# Patient Record
Sex: Female | Born: 1965 | Race: White | Hispanic: No | Marital: Married | Smoking: Never smoker
Health system: Southern US, Community
[De-identification: ages and names within clinical notes are randomized; demographics above are authoritative.]

## PROBLEM LIST (undated history)

## (undated) DIAGNOSIS — E7800 Pure hypercholesterolemia, unspecified: Secondary | ICD-10-CM

## (undated) DIAGNOSIS — I10 Essential (primary) hypertension: Secondary | ICD-10-CM

## (undated) DIAGNOSIS — D6859 Other primary thrombophilia: Secondary | ICD-10-CM

## (undated) DIAGNOSIS — J310 Chronic rhinitis: Secondary | ICD-10-CM

## (undated) DIAGNOSIS — K219 Gastro-esophageal reflux disease without esophagitis: Secondary | ICD-10-CM

## (undated) DIAGNOSIS — F419 Anxiety disorder, unspecified: Secondary | ICD-10-CM

## (undated) HISTORY — PX: BREAST SURGERY: SHX581

## (undated) HISTORY — PX: TUBAL LIGATION: SHX77

---

## 2005-07-06 ENCOUNTER — Ambulatory Visit: Payer: Self-pay | Admitting: Family Medicine

## 2005-07-28 ENCOUNTER — Ambulatory Visit: Payer: Self-pay | Admitting: Family Medicine

## 2007-01-30 ENCOUNTER — Ambulatory Visit: Payer: Self-pay | Admitting: Obstetrics and Gynecology

## 2008-02-05 ENCOUNTER — Ambulatory Visit: Payer: Self-pay | Admitting: Obstetrics and Gynecology

## 2009-02-10 ENCOUNTER — Ambulatory Visit: Payer: Self-pay | Admitting: Obstetrics and Gynecology

## 2009-12-08 ENCOUNTER — Ambulatory Visit: Payer: Self-pay | Admitting: Obstetrics and Gynecology

## 2009-12-09 ENCOUNTER — Ambulatory Visit: Payer: Self-pay | Admitting: Internal Medicine

## 2009-12-11 ENCOUNTER — Ambulatory Visit: Payer: Self-pay | Admitting: Obstetrics and Gynecology

## 2010-03-31 ENCOUNTER — Ambulatory Visit: Payer: Self-pay | Admitting: Obstetrics and Gynecology

## 2011-08-15 ENCOUNTER — Ambulatory Visit: Payer: Self-pay | Admitting: Obstetrics and Gynecology

## 2012-08-15 ENCOUNTER — Ambulatory Visit: Payer: Self-pay | Admitting: Obstetrics and Gynecology

## 2013-08-19 ENCOUNTER — Ambulatory Visit: Payer: Self-pay | Admitting: Obstetrics and Gynecology

## 2014-09-08 ENCOUNTER — Ambulatory Visit: Payer: Self-pay | Admitting: Obstetrics and Gynecology

## 2014-09-16 ENCOUNTER — Ambulatory Visit: Payer: Self-pay | Admitting: Obstetrics and Gynecology

## 2015-01-16 LAB — BASIC METABOLIC PANEL
BUN: 11 mg/dL (ref 4–21)
CREATININE: 0.6 mg/dL (ref 0.5–1.1)
GLUCOSE: 101 mg/dL
Potassium: 4.3 mmol/L (ref 3.4–5.3)
Sodium: 138 mmol/L (ref 137–147)

## 2015-01-16 LAB — LIPID PANEL
Cholesterol: 192 mg/dL (ref 0–200)
HDL: 43 mg/dL (ref 35–70)
LDL CALC: 117 mg/dL
LDL/HDL RATIO: 2.7
TRIGLYCERIDES: 160 mg/dL (ref 40–160)

## 2015-01-16 LAB — TSH: TSH: 2.06 u[IU]/mL (ref 0.41–5.90)

## 2015-01-16 LAB — HEPATIC FUNCTION PANEL
ALT: 12 U/L (ref 7–35)
AST: 14 U/L (ref 13–35)
Alkaline Phosphatase: 87 U/L (ref 25–125)
Bilirubin, Total: 0.3 mg/dL

## 2015-01-16 LAB — CBC AND DIFFERENTIAL
HCT: 35 % — AB (ref 36–46)
Hemoglobin: 11.4 g/dL — AB (ref 12.0–16.0)
NEUTROS ABS: 6 /uL
PLATELETS: 376 10*3/uL (ref 150–399)
WBC: 9.1 10^3/mL

## 2015-06-19 DIAGNOSIS — E1169 Type 2 diabetes mellitus with other specified complication: Secondary | ICD-10-CM | POA: Insufficient documentation

## 2015-06-19 DIAGNOSIS — D6859 Other primary thrombophilia: Secondary | ICD-10-CM | POA: Insufficient documentation

## 2015-06-19 DIAGNOSIS — I152 Hypertension secondary to endocrine disorders: Secondary | ICD-10-CM | POA: Insufficient documentation

## 2015-06-19 DIAGNOSIS — I10 Essential (primary) hypertension: Secondary | ICD-10-CM | POA: Insufficient documentation

## 2015-06-19 DIAGNOSIS — J309 Allergic rhinitis, unspecified: Secondary | ICD-10-CM | POA: Insufficient documentation

## 2015-06-19 DIAGNOSIS — F419 Anxiety disorder, unspecified: Secondary | ICD-10-CM | POA: Insufficient documentation

## 2015-06-19 DIAGNOSIS — E7800 Pure hypercholesterolemia, unspecified: Secondary | ICD-10-CM | POA: Insufficient documentation

## 2015-06-19 DIAGNOSIS — E785 Hyperlipidemia, unspecified: Secondary | ICD-10-CM | POA: Insufficient documentation

## 2015-06-19 DIAGNOSIS — K219 Gastro-esophageal reflux disease without esophagitis: Secondary | ICD-10-CM | POA: Insufficient documentation

## 2015-06-30 ENCOUNTER — Ambulatory Visit (INDEPENDENT_AMBULATORY_CARE_PROVIDER_SITE_OTHER): Payer: BLUE CROSS/BLUE SHIELD | Admitting: Family Medicine

## 2015-06-30 ENCOUNTER — Encounter: Payer: Self-pay | Admitting: Family Medicine

## 2015-06-30 VITALS — BP 102/70 | HR 72 | Temp 98.9°F | Resp 14 | Wt 161.0 lb

## 2015-06-30 DIAGNOSIS — Z23 Encounter for immunization: Secondary | ICD-10-CM

## 2015-06-30 DIAGNOSIS — K219 Gastro-esophageal reflux disease without esophagitis: Secondary | ICD-10-CM

## 2015-06-30 DIAGNOSIS — E7800 Pure hypercholesterolemia, unspecified: Secondary | ICD-10-CM | POA: Diagnosis not present

## 2015-06-30 DIAGNOSIS — M533 Sacrococcygeal disorders, not elsewhere classified: Secondary | ICD-10-CM

## 2015-06-30 DIAGNOSIS — I10 Essential (primary) hypertension: Secondary | ICD-10-CM

## 2015-06-30 MED ORDER — NAPROXEN 500 MG PO TABS: 500.0000 mg | ORAL_TABLET | Freq: Two times a day (BID) | ORAL | Status: DC

## 2015-06-30 NOTE — Progress Notes (Signed)
Patient ID: Karen Henry, female   DOB: May 12, 1966, 49 y.o.   MRN: [id number]    Subjective:  HPI  Hypertension follow up: Patient was last seen in May 2016. Not really checking her B/P BP Readings from Last 3 Encounters:  06/30/15 102/70  12/30/14 104/62   GERD follow up: Symtpoms are stable. Symptoms are controlled on Omeprazole daily.  Hyperlipidemia follow up: Patient is taking Crestor.  Lab Results  Component Value Date   CHOL 192 01/16/2015   HDL 43 01/16/2015   LDLCALC 117 01/16/2015   TRIG 160 01/16/2015   Back pain: Patient has had trouble with left hip pain for about 1 month. She can feel a pain when she is Rolling over on her side while in bed and she feels it with certain movements this bothers her and pushing on the area she can feel a bulging on left side of the back verses right side.  Prior to Admission medications   Medication Sig Start Date End Date Taking? Authorizing Provider  aspirin 81 MG tablet Take by mouth.    Historical Provider, MD  Bioflavonoid Products (BIOFLEX) TABS Take by mouth.    Historical Provider, MD  Bioflavonoid Products (VITAMIN C) CHEW Chew by mouth.    Historical Provider, MD  bisoprolol-hydrochlorothiazide [facility](ZIAC) 5-6.25 MG tablet Take by mouth. 06/30/14   Historical Provider, MD  Cetirizine HCl (ZYRTEC ALLERGY) 10 MG CAPS Take by mouth.    Historical Provider, MD  COENZYME Q-10 PO Take by mouth. 07/06/11   Historical Provider, MD  omeprazole (PRILOSEC) 20 MG capsule Take by mouth. 09/07/14   Historical Provider, MD  rosuvastatin (CRESTOR) 10 MG tablet Take by mouth. 07/10/14   Historical Provider, MD    Patient Active Problem List   Diagnosis Date Noted  . Allergic rhinitis 06/19/2015  . Anxiety 06/19/2015  . Essential (primary) hypertension 06/19/2015  . Primary hypercoagulable state (HCC) 06/19/2015  . Acid reflux 06/19/2015  . Hypercholesteremia 06/19/2015    No past medical history on file.  Social History   Social  History  . Marital Status: Married    Spouse Name: N/A  . Number of Children: N/A  . Years of Education: N/A   Occupational History  . Not on file.   Social History Main Topics  . Smoking status: Never Smoker   . Smokeless tobacco: Never Used  . Alcohol Use: Yes     Comment: social  . Drug Use: No  . Sexual Activity: Not on file   Other Topics Concern  . Not on file   Social History Narrative    Allergies  Allergen Reactions  . Penicillins Rash    Review of Systems  Constitutional: Negative.   Eyes: Negative.   Respiratory: Negative.   Cardiovascular: Negative.   Gastrointestinal: Negative.   Musculoskeletal: Positive for joint pain.  Skin: Negative.   Neurological: Negative.   Endo/Heme/Allergies: Negative.   Psychiatric/Behavioral: Negative.     Immunization History  Administered Date(s) Administered  . Tdap 06/30/2014   Objective:  BP 102/70 mmHg  Pulse 72  Temp(Src) 98.9 F (37.2 C)  Resp 14  Wt 161 lb (73.029 kg)  Physical Exam  Constitutional: She is oriented to person, place, and time and well-developed, well-nourished, and in no distress.  HENT:  Head: Normocephalic and atraumatic.  Right Ear: External ear normal.  Left Ear: External ear normal.  Nose: Nose normal.  Eyes: Conjunctivae are normal. Pupils are equal, round, and reactive to light.  Neck: Normal range  of motion. Neck supple.  Cardiovascular: Normal rate, regular rhythm, normal heart sounds and intact distal pulses.   No murmur heard. Pulmonary/Chest: Effort normal and breath sounds normal. No respiratory distress. She has no wheezes.  Abdominal: Soft.  Musculoskeletal:  Tenderness over SI joint  Neurological: She is alert and oriented to person, place, and time.  Psychiatric: Mood, memory, affect and judgment normal.    Lab Results  Component Value Date   WBC 9.1 01/16/2015   HGB 11.4* 01/16/2015   HCT 35* 01/16/2015   PLT 376 01/16/2015   CHOL 192 01/16/2015   TRIG  160 01/16/2015   HDL 43 01/16/2015   LDLCALC 117 01/16/2015   TSH 2.06 01/16/2015    CMP     Component Value Date/Time   NA 138 01/16/2015   K 4.3 01/16/2015   BUN 11 01/16/2015   CREATININE 0.6 01/16/2015   AST 14 01/16/2015   ALT 12 01/16/2015   ALKPHOS 87 01/16/2015    Assessment and Plan :  1. Essential (primary) hypertension  - Comprehensive metabolic panel - TSH  2. Gastroesophageal reflux disease, esophagitis presence not specified  - TSH  3. Hypercholesteremia  - Comprehensive metabolic panel - Lipid Panel With LDL/HDL Ratio  4. Sacroiliac pain Discussed stretching and regular walking to strengthen core. May need PT referral.  5. Need for influenza vaccination  - Flu Vaccine QUAD 36+ mos IM  I have done the exam and reviewed the above chart and it is accurate to the best of my knowledge.  Julieanne Manson MD [facility] 06/30/2015 4:28 PM

## 2015-07-02 ENCOUNTER — Telehealth: Payer: Self-pay | Admitting: Family Medicine

## 2015-07-02 NOTE — Telephone Encounter (Signed)
Pt stated that she forgot her lab slip when she was here on 06/30/15 and is requesting to pick up another copy. Thanks TNP

## 2015-07-02 NOTE — Telephone Encounter (Signed)
Lab slip printed and placed up front. Patient advised.

## 2015-07-25 ENCOUNTER — Other Ambulatory Visit: Payer: Self-pay | Admitting: Family Medicine

## 2015-07-31 LAB — COMPREHENSIVE METABOLIC PANEL
A/G RATIO: 1.5 (ref 1.1–2.5)
ALBUMIN: 4.3 g/dL (ref 3.5–5.5)
ALT: 15 IU/L (ref 0–32)
AST: 14 IU/L (ref 0–40)
Alkaline Phosphatase: 100 IU/L (ref 39–117)
BILIRUBIN TOTAL: 0.4 mg/dL (ref 0.0–1.2)
BUN / CREAT RATIO: 19 (ref 9–23)
BUN: 12 mg/dL (ref 6–24)
CALCIUM: 9.3 mg/dL (ref 8.7–10.2)
CO2: 25 mmol/L (ref 18–29)
Chloride: 96 mmol/L — ABNORMAL LOW (ref 97–106)
Creatinine, Ser: 0.62 mg/dL (ref 0.57–1.00)
GFR, EST AFRICAN AMERICAN: 123 mL/min/{1.73_m2} (ref >59)
GFR, EST NON AFRICAN AMERICAN: 107 mL/min/{1.73_m2} (ref >59)
GLUCOSE: 91 mg/dL (ref 65–99)
Globulin, Total: 2.8 g/dL (ref 1.5–4.5)
Potassium: 4.4 mmol/L (ref 3.5–5.2)
Sodium: 135 mmol/L — ABNORMAL LOW (ref 136–144)
TOTAL PROTEIN: 7.1 g/dL (ref 6.0–8.5)

## 2015-07-31 LAB — LIPID PANEL WITH LDL/HDL RATIO
Cholesterol, Total: 166 mg/dL (ref 100–199)
HDL: 43 mg/dL (ref >39)
LDL Calculated: 94 mg/dL (ref 0–99)
LDL/HDL RATIO: 2.2 ratio (ref 0.0–3.2)
Triglycerides: 144 mg/dL (ref 0–149)
VLDL CHOLESTEROL CAL: 29 mg/dL (ref 5–40)

## 2015-07-31 LAB — TSH: TSH: 1.64 u[IU]/mL (ref 0.450–4.500)

## 2015-08-05 ENCOUNTER — Telehealth: Payer: Self-pay | Admitting: Family Medicine

## 2015-08-05 NOTE — Telephone Encounter (Signed)
LMTCB ED 

## 2015-08-05 NOTE — Telephone Encounter (Signed)
-----   Message from Maple Hudsonichard L Gilbert Jr., MD sent at 07/31/2015  8:44 AM EST ----- Stable labs.

## 2015-08-05 NOTE — Telephone Encounter (Signed)
Advised  ED 

## 2015-08-26 ENCOUNTER — Other Ambulatory Visit: Payer: Self-pay | Admitting: Obstetrics and Gynecology

## 2015-08-26 DIAGNOSIS — Z1231 Encounter for screening mammogram for malignant neoplasm of breast: Secondary | ICD-10-CM

## 2015-08-26 LAB — HM PAP SMEAR

## 2015-09-10 ENCOUNTER — Ambulatory Visit
Admission: RE | Admit: 2015-09-10 | Discharge: 2015-09-10 | Disposition: A | Discharge status: Home / Self Care | Payer: BLUE CROSS/BLUE SHIELD | Source: Ambulatory Visit | Attending: Obstetrics and Gynecology | Admitting: Obstetrics and Gynecology

## 2015-09-10 DIAGNOSIS — Z1231 Encounter for screening mammogram for malignant neoplasm of breast: Secondary | ICD-10-CM | POA: Diagnosis not present

## 2015-11-26 ENCOUNTER — Other Ambulatory Visit: Payer: Self-pay | Admitting: Family Medicine

## 2015-12-28 ENCOUNTER — Ambulatory Visit: Payer: BLUE CROSS/BLUE SHIELD | Admitting: Family Medicine

## 2015-12-31 ENCOUNTER — Ambulatory Visit (INDEPENDENT_AMBULATORY_CARE_PROVIDER_SITE_OTHER): Payer: BLUE CROSS/BLUE SHIELD | Admitting: Family Medicine

## 2015-12-31 VITALS — BP 118/66 | HR 73 | Temp 98.6°F | Resp 12 | Wt 161.0 lb

## 2015-12-31 DIAGNOSIS — J069 Acute upper respiratory infection, unspecified: Secondary | ICD-10-CM

## 2015-12-31 DIAGNOSIS — K219 Gastro-esophageal reflux disease without esophagitis: Secondary | ICD-10-CM

## 2015-12-31 DIAGNOSIS — M533 Sacrococcygeal disorders, not elsewhere classified: Secondary | ICD-10-CM

## 2015-12-31 DIAGNOSIS — E7800 Pure hypercholesterolemia, unspecified: Secondary | ICD-10-CM | POA: Diagnosis not present

## 2015-12-31 DIAGNOSIS — I10 Essential (primary) hypertension: Secondary | ICD-10-CM | POA: Diagnosis not present

## 2015-12-31 DIAGNOSIS — J302 Other seasonal allergic rhinitis: Secondary | ICD-10-CM | POA: Diagnosis not present

## 2015-12-31 MED ORDER — OMEPRAZOLE 20 MG PO CPDR: 20.0000 mg | DELAYED_RELEASE_CAPSULE | Freq: Every day | ORAL | Status: DC

## 2015-12-31 MED ORDER — NAPROXEN 500 MG PO TABS: 500.0000 mg | ORAL_TABLET | Freq: Two times a day (BID) | ORAL | Status: DC

## 2015-12-31 MED ORDER — AZITHROMYCIN 250 MG PO TABS: ORAL_TABLET | ORAL | Status: DC

## 2015-12-31 MED ORDER — ROSUVASTATIN CALCIUM 10 MG PO TABS: 10.0000 mg | ORAL_TABLET | Freq: Every day | ORAL | Status: DC

## 2015-12-31 MED ORDER — BISOPROLOL-HYDROCHLOROTHIAZIDE 5-6.25 MG PO TABS: 1.0000 | ORAL_TABLET | Freq: Every day | ORAL | Status: DC

## 2015-12-31 NOTE — Progress Notes (Signed)
Patient ID: Karen Henry, female   DOB: [birth year], 50 y.o.   MRN: [id number]    Subjective:  HPI  Patient is here for 6 months follow up.  Hypertension: Patient checks her B/P and readings have been around 118/70s. BP Readings from Last 3 Encounters:  12/31/15 118/66  06/30/15 102/70  12/30/14 104/62   GERD: Patient takes Omeprazole 20 mg 1 daily, she tried not taking this daily before but gets break through heartburn.  Patient also developed cold symptoms 4 days ago: post nasal drainage, nasal congestion, head pressure, cough. She has been blowing out yellowish mucus. No fever. Patient has taking Alkelsetzer cold plus and generic Mucinex. She does take Zyrtec for allergies daily.  Prior to Admission medications   Medication Sig Start Date End Date Taking? Authorizing Provider  aspirin 81 MG tablet Take by mouth.   Yes Historical Provider, MD  Bioflavonoid Products (BIOFLEX) TABS Take by mouth.   Yes Historical Provider, MD  Bioflavonoid Products (VITAMIN C) CHEW Chew by mouth.   Yes Historical Provider, MD  bisoprolol-hydrochlorothiazide [facility]) 5-6.25 MG tablet TAKE 1 TABLET BY MOUTH EVERY DAY 07/27/15  Yes Maple Hudson., MD  Cetirizine HCl (ZYRTEC ALLERGY) 10 MG CAPS Take by mouth.   Yes Historical Provider, MD  COENZYME Q-10 PO Take by mouth. 07/06/11  Yes Historical Provider, MD  naproxen (NAPROSYN) 500 MG tablet Take 1 tablet (500 mg total) by mouth 2 (two) times daily with a meal. 06/30/15  Yes Maple Hudson., MD  omeprazole (PRILOSEC) 20 MG capsule TAKE ONE CAPSULE BY MOUTH DAILY 11/26/15  Yes Maple Hudson., MD  rosuvastatin (CRESTOR) 10 MG tablet Take by mouth. 07/10/14  Yes Historical Provider, MD    Patient Active Problem List   Diagnosis Date Noted  . Allergic rhinitis 06/19/2015  . Anxiety 06/19/2015  . Essential (primary) hypertension 06/19/2015  . Primary hypercoagulable state (HCC) 06/19/2015  . Acid reflux 06/19/2015  . Hypercholesteremia  06/19/2015    No past medical history on file.  Social History   Social History  . Marital Status: Married    Spouse Name: N/A  . Number of Children: N/A  . Years of Education: N/A   Occupational History  . Not on file.   Social History Main Topics  . Smoking status: Never Smoker   . Smokeless tobacco: Never Used  . Alcohol Use: Yes     Comment: social  . Drug Use: No  . Sexual Activity: Not on file   Other Topics Concern  . Not on file   Social History Narrative    Allergies  Allergen Reactions  . Penicillins Rash    Review of Systems  Constitutional: Positive for malaise/fatigue.  HENT: Positive for congestion.   Eyes: Negative.   Respiratory: Positive for cough and sputum production.   Cardiovascular: Negative.   Gastrointestinal: Negative.   Musculoskeletal: Positive for joint pain.  Skin: Negative.   Neurological: Negative.   Endo/Heme/Allergies: Negative.   Psychiatric/Behavioral: Negative.     Immunization History  Administered Date(s) Administered  . Influenza,inj,Quad PF,36+ Mos 06/30/2015  . Tdap 06/30/2014   Objective:  BP 118/66 mmHg  Pulse 73  Temp(Src) 98.6 F (37 C)  Resp 12  Wt 161 lb (73.029 kg)  SpO2 97%  Physical Exam  Constitutional: She is oriented to person, place, and time and well-developed, well-nourished, and in no distress.  HENT:  Head: Normocephalic and atraumatic.  Right Ear: External ear normal.  Left Ear: External  ear normal.  Mouth/Throat: Oropharynx is clear and moist.  Eyes: Conjunctivae are normal. Pupils are equal, round, and reactive to light.  Neck: Normal range of motion. Neck supple.  Cardiovascular: Normal rate, regular rhythm, normal heart sounds and intact distal pulses.   No murmur heard. Pulmonary/Chest: Effort normal and breath sounds normal. No respiratory distress. She has no wheezes.  Abdominal: Soft.  Neurological: She is alert and oriented to person, place, and time.  Skin: Skin is warm  and dry.  Psychiatric: Mood, memory, affect and judgment normal.    Lab Results  Component Value Date   WBC 9.1 01/16/2015   HGB 11.4* 01/16/2015   HCT 35* 01/16/2015   PLT 376 01/16/2015   GLUCOSE 91 07/30/2015   CHOL 166 07/30/2015   TRIG 144 07/30/2015   HDL 43 07/30/2015   LDLCALC 94 07/30/2015   TSH 1.640 07/30/2015    CMP     Component Value Date/Time   NA 135* 07/30/2015 0804   K 4.4 07/30/2015 0804   CL 96* 07/30/2015 0804   CO2 25 07/30/2015 0804   GLUCOSE 91 07/30/2015 0804   BUN 12 07/30/2015 0804   CREATININE 0.62 07/30/2015 0804   CREATININE 0.6 01/16/2015   CALCIUM 9.3 07/30/2015 0804   PROT 7.1 07/30/2015 0804   ALBUMIN 4.3 07/30/2015 0804   AST 14 07/30/2015 0804   ALT 15 07/30/2015 0804   ALKPHOS 100 07/30/2015 0804   BILITOT 0.4 07/30/2015 0804   GFRNONAA 107 07/30/2015 0804   GFRAA 123 07/30/2015 0804    Assessment and Plan :  1. Essential (primary) hypertension Stable. Continue current medication. 2. Gastroesophageal reflux disease, esophagitis presence not specified Stable on Omeprazole. Had break through heartburn when not on the medication daily.  3. Hypercholesteremia Refills for all medications provided today. 4. Sacroiliac pain Discussed with patient not take Naproxen daily and possible risks of this, patient takes as needed. 5. Other seasonal allergic rhinitis Stable.  6. Cold symptoms Patient advised that these symptoms can last to 10 days or longer. This seems to be viral at this time, provided RX for Zpak in case symptoms get worse. Follow as needed. I have done the exam and reviewed the above chart and it is accurate to the best of my knowledge.  Patient was seen and examined by Dr. Bosie Closichard L Gilbert and note was scribed by Samara DeistAnastasiya Aleksandrova, RMA.    Julieanne Mansonichard Gilbert MD [facility] 12/31/2015 4:16 PM

## 2016-04-27 LAB — COMPREHENSIVE METABOLIC PANEL
A/G RATIO: 1.5 (ref 1.2–2.2)
ALT: 15 IU/L (ref 0–32)
AST: 15 IU/L (ref 0–40)
Albumin: 4.3 g/dL (ref 3.5–5.5)
Alkaline Phosphatase: 88 IU/L (ref 39–117)
BILIRUBIN TOTAL: 0.2 mg/dL (ref 0.0–1.2)
BUN/Creatinine Ratio: 15 (ref 9–23)
BUN: 11 mg/dL (ref 6–24)
CALCIUM: 9.7 mg/dL (ref 8.7–10.2)
CO2: 26 mmol/L (ref 18–29)
Chloride: 99 mmol/L (ref 96–106)
Creatinine, Ser: 0.75 mg/dL (ref 0.57–1.00)
GFR calc Af Amer: 108 mL/min/{1.73_m2} (ref >59)
GFR calc non Af Amer: 94 mL/min/{1.73_m2} (ref >59)
GLUCOSE: 101 mg/dL — AB (ref 65–99)
Globulin, Total: 2.8 g/dL (ref 1.5–4.5)
Potassium: 4.4 mmol/L (ref 3.5–5.2)
Sodium: 140 mmol/L (ref 134–144)
TOTAL PROTEIN: 7.1 g/dL (ref 6.0–8.5)

## 2016-04-27 LAB — CBC WITH DIFFERENTIAL/PLATELET
BASOS ABS: 0.1 10*3/uL (ref 0.0–0.2)
Basos: 1 %
EOS (ABSOLUTE): 0.6 10*3/uL — AB (ref 0.0–0.4)
Eos: 7 %
Hematocrit: 34.4 % (ref 34.0–46.6)
Hemoglobin: 11.3 g/dL (ref 11.1–15.9)
IMMATURE GRANS (ABS): 0 10*3/uL (ref 0.0–0.1)
IMMATURE GRANULOCYTES: 0 %
LYMPHS: 23 %
Lymphocytes Absolute: 2 10*3/uL (ref 0.7–3.1)
MCH: 27.6 pg (ref 26.6–33.0)
MCHC: 32.8 g/dL (ref 31.5–35.7)
MCV: 84 fL (ref 79–97)
MONOS ABS: 0.7 10*3/uL (ref 0.1–0.9)
Monocytes: 8 %
NEUTROS PCT: 61 %
Neutrophils Absolute: 5.4 10*3/uL (ref 1.4–7.0)
PLATELETS: 311 10*3/uL (ref 150–379)
RBC: 4.09 x10E6/uL (ref 3.77–5.28)
RDW: 14.3 % (ref 12.3–15.4)
WBC: 8.7 10*3/uL (ref 3.4–10.8)

## 2016-04-27 LAB — LIPID PANEL WITH LDL/HDL RATIO
CHOLESTEROL TOTAL: 164 mg/dL (ref 100–199)
HDL: 46 mg/dL (ref >39)
LDL CALC: 92 mg/dL (ref 0–99)
LDl/HDL Ratio: 2 ratio units (ref 0.0–3.2)
TRIGLYCERIDES: 130 mg/dL (ref 0–149)
VLDL CHOLESTEROL CAL: 26 mg/dL (ref 5–40)

## 2016-04-27 LAB — TSH: TSH: 1.65 u[IU]/mL (ref 0.450–4.500)

## 2016-05-24 IMAGING — MG MM DIGITAL SCREENING BILAT W/ CAD
7 series · 7 of 7 positions shown · non-contrast
Comparison: Previous Exam(s)

CLINICAL DATA: Screening.

EXAM:
DIGITAL SCREENING BILATERAL MAMMOGRAM WITH CAD

[L XCCL]
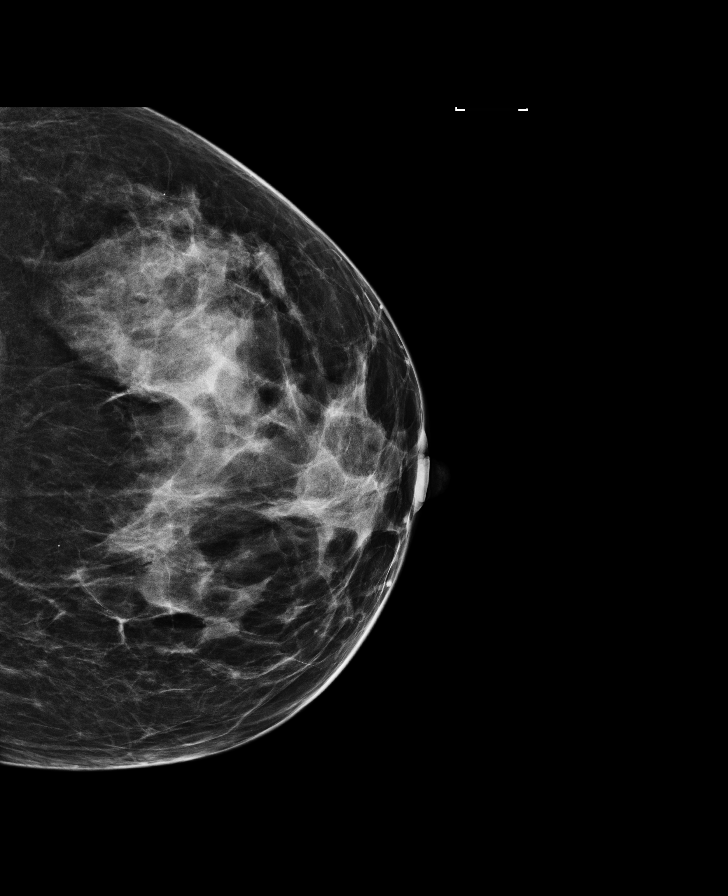

[R CC (1 of 2)]
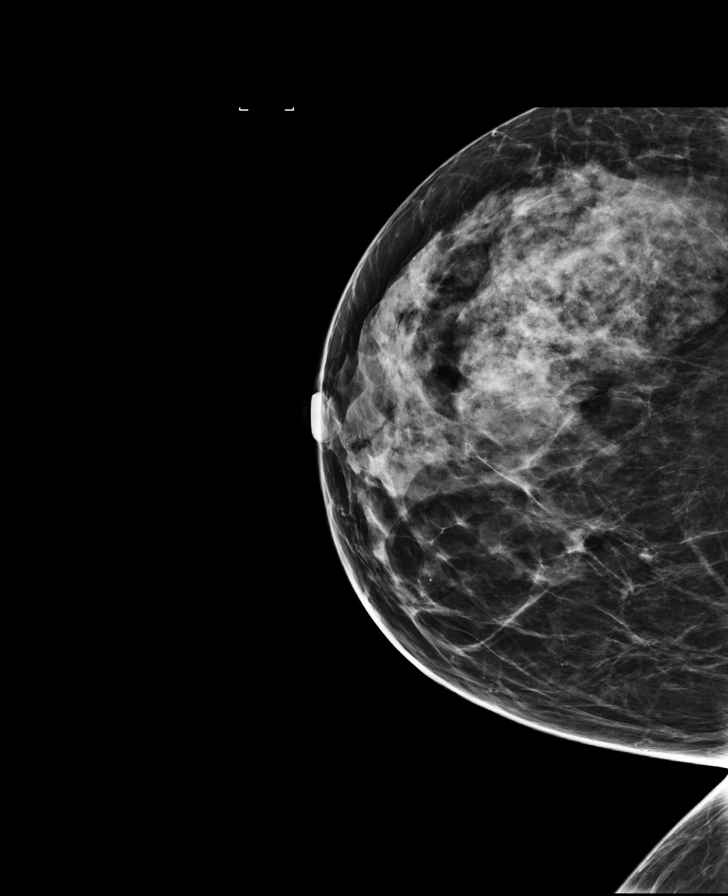

[L CC]
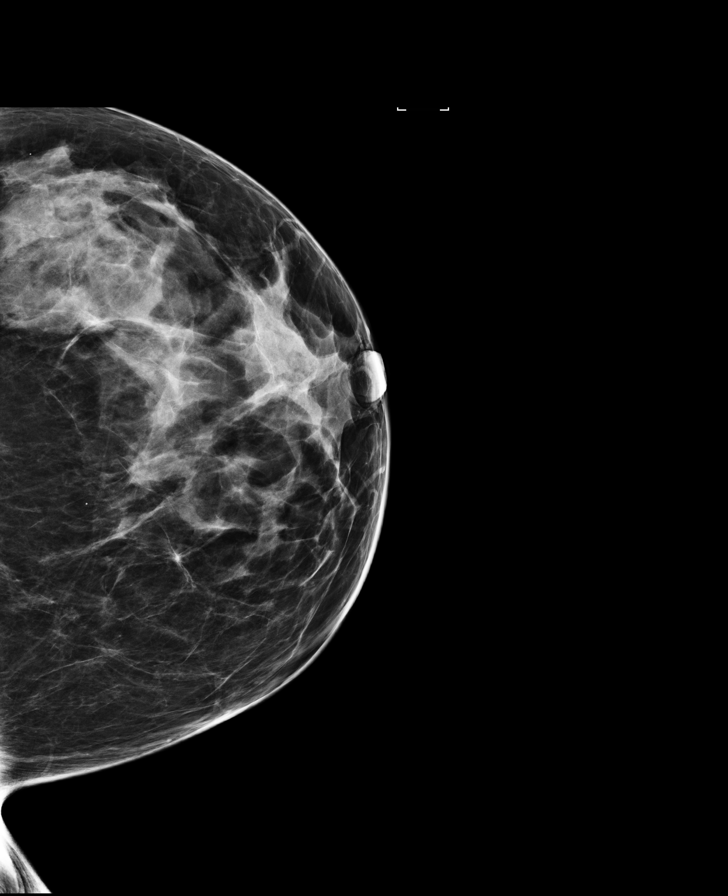

[R MLO (1 of 2)]
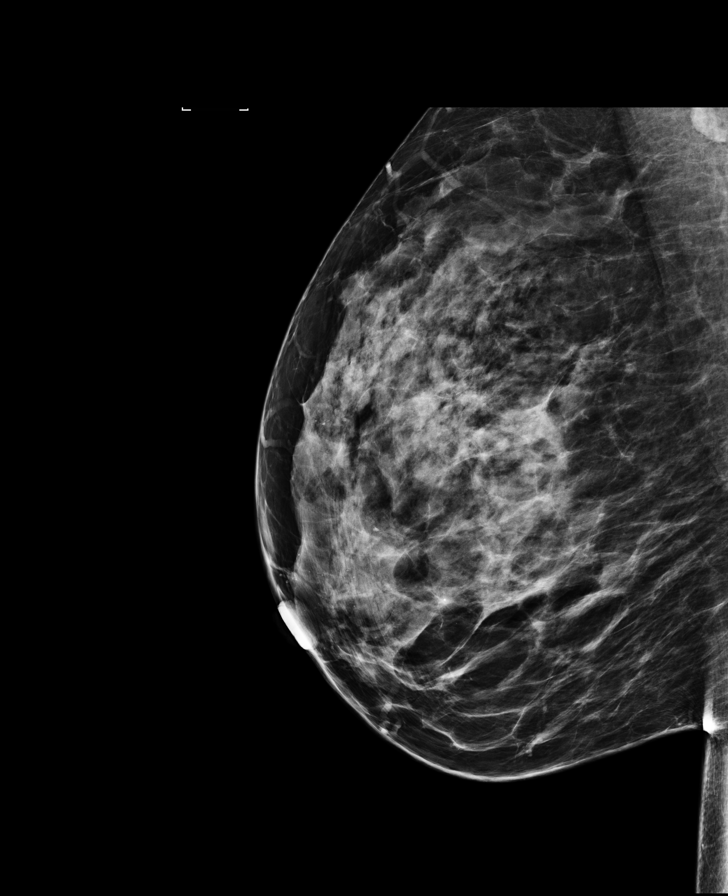

[L MLO]
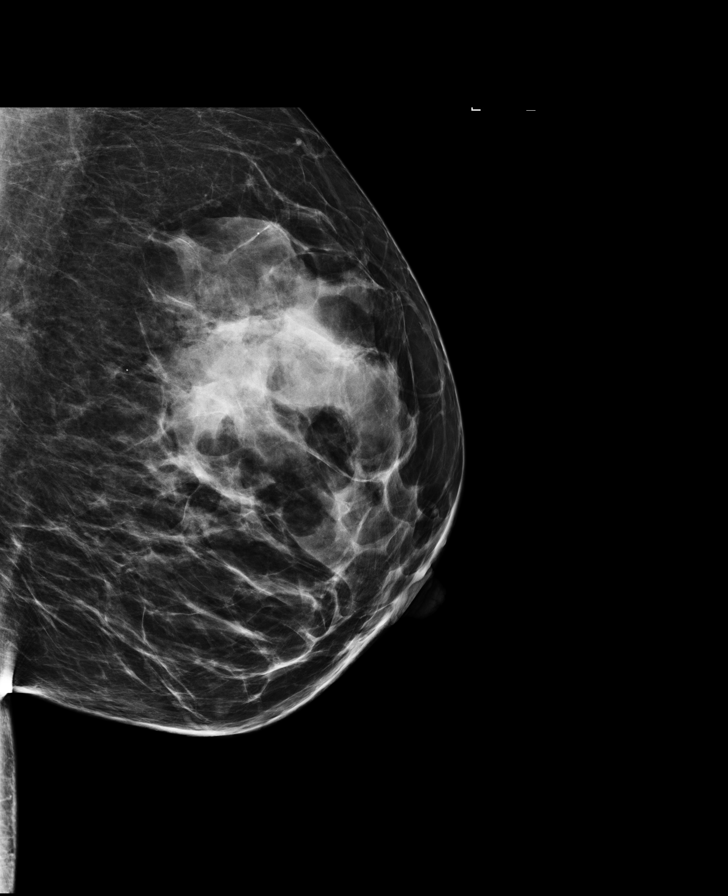

[R MLO (2 of 2)]
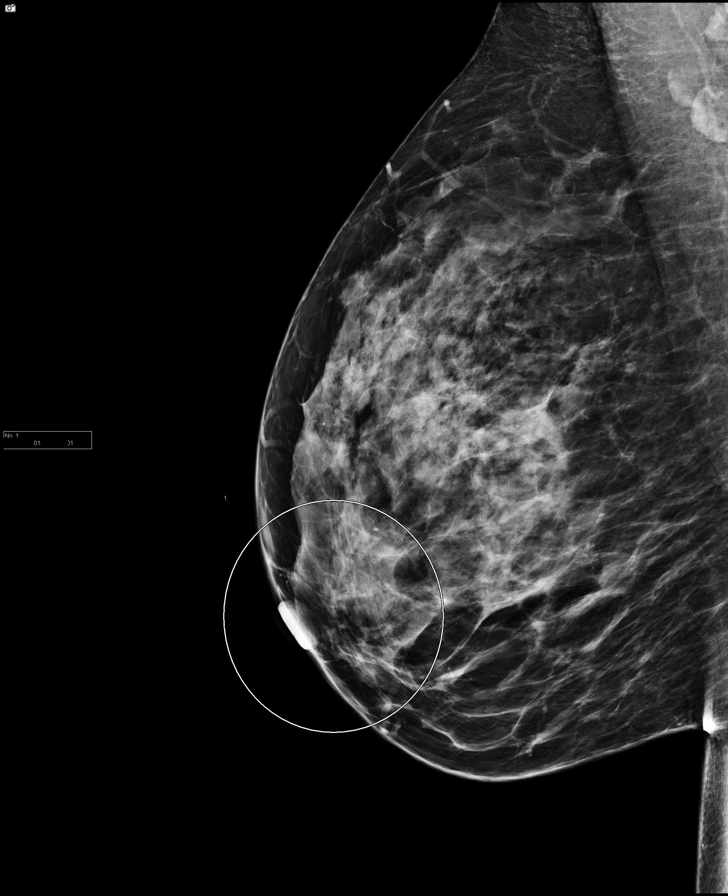

[R CC (2 of 2)]
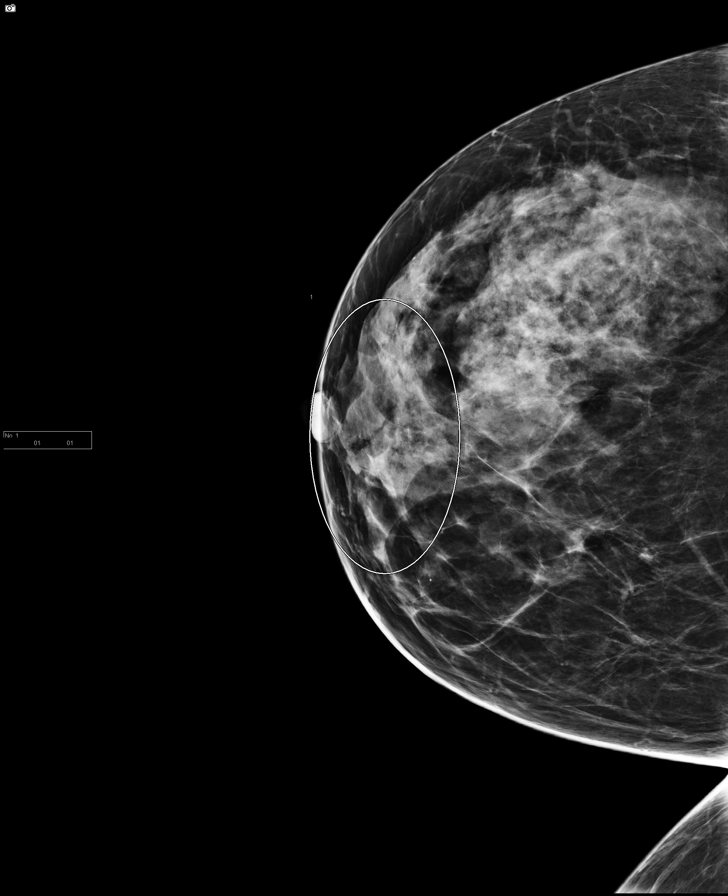

[7 of 7 positions shown; findings below may reference images not displayed]

ACR Breast Density Category d: The breast tissue is extremely dense,
which lowers the sensitivity of mammography.
FINDINGS: In the right breast, calcifications warrant further evaluation with
magnified views. In the left breast, no findings suspicious for
malignancy. Images were processed with CAD.
IMPRESSION: Further evaluation is suggested for calcifications in the right
breast.

RECOMMENDATION:
Diagnostic mammogram of the right breast. (Code:H2-0-44D)

The patient will be contacted regarding the findings, and additional
imaging will be scheduled.

BI-RADS CATEGORY  0: Incomplete. Need additional imaging evaluation
and/or prior mammograms for comparison.

## 2016-07-06 ENCOUNTER — Ambulatory Visit (INDEPENDENT_AMBULATORY_CARE_PROVIDER_SITE_OTHER): Payer: BLUE CROSS/BLUE SHIELD | Admitting: Family Medicine

## 2016-07-06 VITALS — BP 122/82 | HR 84 | Temp 98.6°F | Resp 14 | Wt 166.0 lb

## 2016-07-06 DIAGNOSIS — I10 Essential (primary) hypertension: Secondary | ICD-10-CM

## 2016-07-06 DIAGNOSIS — K219 Gastro-esophageal reflux disease without esophagitis: Secondary | ICD-10-CM

## 2016-07-06 DIAGNOSIS — E7800 Pure hypercholesterolemia, unspecified: Secondary | ICD-10-CM | POA: Diagnosis not present

## 2016-07-06 DIAGNOSIS — J302 Other seasonal allergic rhinitis: Secondary | ICD-10-CM | POA: Diagnosis not present

## 2016-07-06 NOTE — Progress Notes (Signed)
Karen Henry  MRN: [id number] DOB: [birth year]  Subjective:  HPI  Hypertension: patient does not check her b/p. No cardiac symptoms present. BP Readings from Last 3 Encounters:  07/06/16 122/82  12/31/15 118/66  06/30/15 102/70   GERD: patient takes Omeprazole daily, symptoms are controlled as long as she is on the medication without it she has breakthrough. Patient Active Problem List   Diagnosis Date Noted  . Allergic rhinitis 06/19/2015  . Anxiety 06/19/2015  . Essential (primary) hypertension 06/19/2015  . Primary hypercoagulable state (HCC) 06/19/2015  . Acid reflux 06/19/2015  . Hypercholesteremia 06/19/2015    No past medical history on file.  Social History   Social History  . Marital status: Married    Spouse name: N/A  . Number of children: N/A  . Years of education: N/A   Occupational History  . Not on file.   Social History Main Topics  . Smoking status: Never Smoker  . Smokeless tobacco: Never Used  . Alcohol use Yes     Comment: social  . Drug use: No  . Sexual activity: Not on file   Other Topics Concern  . Not on file   Social History Narrative  . No narrative on file    Outpatient Encounter Prescriptions as of 07/06/2016  Medication Sig Note  . aspirin 81 MG tablet Take by mouth. 06/19/2015: [facility]  . Bioflavonoid Products (VITAMIN C) CHEW Chew by mouth. 06/19/2015: Received from: [facility]  . bisoprolol-hydrochlorothiazide (ZIAC) 5-6.25 MG tablet Take 1 tablet by mouth daily.   . Cetirizine HCl (ZYRTEC ALLERGY) 10 MG CAPS Take by mouth. 06/19/2015: Received from: [facility]  . COENZYME Q-10 PO Take by mouth. 06/19/2015: Received from: [facility]  . naproxen (NAPROSYN) 500 MG tablet Take 1 tablet (500 mg total) by mouth 2 (two) times daily with a meal.   . omeprazole (PRILOSEC) 20 MG capsule Take 1 capsule (20 mg total) by mouth daily.   .  rosuvastatin (CRESTOR) 10 MG tablet Take 1 tablet (10 mg total) by mouth daily.   . [DISCONTINUED] azithromycin (ZITHROMAX) 250 MG tablet 2 tablets today and then 1 tablet daily until finished.   . [DISCONTINUED] Bioflavonoid Products (BIOFLEX) TABS Take by mouth. 06/19/2015: Received from: [facility]   No facility-administered encounter medications on file as of 07/06/2016.     Allergies  Allergen Reactions  . Penicillins Rash    Review of Systems  Constitutional: Negative.   Eyes: Negative.   Respiratory: Negative.   Cardiovascular: Negative.   Gastrointestinal: Negative.        Controlled on the medication  Musculoskeletal: Positive for joint pain.  Neurological: Negative.   Endo/Heme/Allergies: Negative.   Psychiatric/Behavioral: Negative.     Objective:  BP 122/82   Pulse 84   Temp 98.6 F (37 C)   Resp 14   Wt 166 lb (75.3 kg)   BMI 30.36 kg/m   Physical Exam  Constitutional: She is oriented to person, place, and time and well-developed, well-nourished, and in no distress.  HENT:  Head: Normocephalic and atraumatic.  Right Ear: External ear normal.  Left Ear: External ear normal.  Nose: Nose normal.  Eyes: Conjunctivae are normal. Pupils are equal, round, and reactive to light.  Neck: Normal range of motion. Neck supple.  Cardiovascular: Normal rate, regular rhythm, normal heart sounds and intact distal pulses.   No murmur heard. Pulmonary/Chest: Effort normal and breath sounds normal. No respiratory distress.  She has no wheezes.  Abdominal: Soft. She exhibits no distension. There is no tenderness.  Musculoskeletal: She exhibits no edema or tenderness.  Neurological: She is alert and oriented to person, place, and time. Gait normal. GCS score is 15.  Skin: Skin is warm and dry.  Psychiatric: Mood, memory, affect and judgment normal.    Assessment and Plan :  1. Essential (primary) hypertension Stable. Continue current medication.  2.  Gastroesophageal reflux disease, esophagitis presence not specified Stable as long as she takes her medication daily.  3. Hypercholesteremia Levels were good on last check in august.  4. Other seasonal allergic rhinitis Stable.  She will get her flu shot through work. HPI, Exam and A&P transcribed under direction and in the presence of Julieanne Mansonichard Gilbert, MD. I have done the exam and reviewed the chart and it is accurate to the best of my knowledge. [employer]  technology has been used and  any errors in dictation or transcription are unintentional. Julieanne Mansonichard Gilbert M.D. [facility]

## 2016-08-18 ENCOUNTER — Other Ambulatory Visit: Payer: Self-pay | Admitting: Obstetrics and Gynecology

## 2016-08-18 DIAGNOSIS — Z1231 Encounter for screening mammogram for malignant neoplasm of breast: Secondary | ICD-10-CM

## 2016-09-16 ENCOUNTER — Ambulatory Visit
Admission: RE | Admit: 2016-09-16 | Discharge: 2016-09-16 | Disposition: A | Discharge status: Home / Self Care | Payer: BLUE CROSS/BLUE SHIELD | Source: Ambulatory Visit | Attending: Obstetrics and Gynecology | Admitting: Obstetrics and Gynecology

## 2016-09-16 DIAGNOSIS — Z1231 Encounter for screening mammogram for malignant neoplasm of breast: Secondary | ICD-10-CM | POA: Diagnosis present

## 2016-11-24 SURGERY — COLONOSCOPY WITH PROPOFOL
Anesthesia: General

## 2016-12-21 ENCOUNTER — Other Ambulatory Visit: Payer: Self-pay | Admitting: Family Medicine

## 2016-12-21 DIAGNOSIS — E7800 Pure hypercholesterolemia, unspecified: Secondary | ICD-10-CM

## 2017-01-05 ENCOUNTER — Ambulatory Visit: Payer: BLUE CROSS/BLUE SHIELD | Admitting: Family Medicine

## 2017-01-10 ENCOUNTER — Ambulatory Visit (INDEPENDENT_AMBULATORY_CARE_PROVIDER_SITE_OTHER): Payer: BLUE CROSS/BLUE SHIELD | Admitting: Family Medicine

## 2017-01-10 ENCOUNTER — Encounter: Payer: Self-pay | Admitting: Family Medicine

## 2017-01-10 VITALS — BP 108/62 | HR 78 | Resp 14 | Ht 60.75 in | Wt 166.0 lb

## 2017-01-10 DIAGNOSIS — Z0000 Encounter for general adult medical examination without abnormal findings: Secondary | ICD-10-CM

## 2017-01-10 DIAGNOSIS — K219 Gastro-esophageal reflux disease without esophagitis: Secondary | ICD-10-CM

## 2017-01-10 DIAGNOSIS — E7800 Pure hypercholesterolemia, unspecified: Secondary | ICD-10-CM

## 2017-01-10 DIAGNOSIS — J302 Other seasonal allergic rhinitis: Secondary | ICD-10-CM | POA: Diagnosis not present

## 2017-01-10 DIAGNOSIS — I10 Essential (primary) hypertension: Secondary | ICD-10-CM | POA: Diagnosis not present

## 2017-01-10 MED ORDER — OMEPRAZOLE 20 MG PO CPDR: 20.0000 mg | DELAYED_RELEASE_CAPSULE | Freq: Every day | ORAL | 3 refills | Status: DC

## 2017-01-10 MED ORDER — ROSUVASTATIN CALCIUM 10 MG PO TABS: 10.0000 mg | ORAL_TABLET | Freq: Every day | ORAL | 3 refills | Status: DC

## 2017-01-10 MED ORDER — BISOPROLOL-HYDROCHLOROTHIAZIDE 5-6.25 MG PO TABS: 1.0000 | ORAL_TABLET | Freq: Every day | ORAL | 3 refills | Status: DC

## 2017-01-10 NOTE — Progress Notes (Signed)
Patient: Karen Henry, Female    DOB: 1966/01/08, 51 y.o.   MRN: [id number] Visit Date: 01/10/2017  Today's Provider: Megan Mans, MD   Chief Complaint  Patient presents with  . Annual Exam   Subjective:  Karen Henry is a 51 y.o. female who presents today for health maintenance and complete physical. She feels fairly well. She reports exercising some walking. She reports she is sleeping well.  Last mammogram 09/16/16 normal          Colonoscopy scheduled for 02/22/17 per patient.     Pap smear done with Dr Feliberto Gottron, January 2018.  Review of Systems  Constitutional: Negative.   HENT: Negative.   Eyes: Negative.   Respiratory: Negative.   Cardiovascular: Negative.   Gastrointestinal: Negative.   Endocrine: Negative.   Genitourinary: Negative.   Musculoskeletal: Negative.   Skin: Negative.   Allergic/Immunologic: Positive for environmental allergies and food allergies.  Neurological: Negative.   Hematological: Negative.   Psychiatric/Behavioral: Negative.     Social History   Social History  . Marital status: Married    Spouse name: N/A  . Number of children: N/A  . Years of education: N/A   Occupational History  . Not on file.   Social History Main Topics  . Smoking status: Never Smoker  . Smokeless tobacco: Never Used  . Alcohol use Yes     Comment: social  . Drug use: No  . Sexual activity: Not on file   Other Topics Concern  . Not on file   Social History Narrative  . No narrative on file    Patient Active Problem List   Diagnosis Date Noted  . Allergic rhinitis 06/19/2015  . Anxiety 06/19/2015  . Essential (primary) hypertension 06/19/2015  . Primary hypercoagulable state (HCC) 06/19/2015  . Acid reflux 06/19/2015  . Hypercholesteremia 06/19/2015    Past Surgical History:  Procedure Laterality Date  . TUBAL LIGATION      Her family history includes ADD / ADHD in her son; Diabetes in her brother; Heart disease in her father;  Hypertension in her father and mother.     Outpatient Encounter Prescriptions as of 01/10/2017  Medication Sig Note  . aspirin 81 MG tablet Take by mouth. 06/19/2015: Received from: [facility]  . Bioflavonoid Products (VITAMIN C) CHEW Chew by mouth. 06/19/2015: Received from: [facility]  . bisoprolol-hydrochlorothiazide (ZIAC) 5-6.25 MG tablet Take 1 tablet by mouth daily.   . Cetirizine HCl (ZYRTEC ALLERGY) 10 MG CAPS Take by mouth. 06/19/2015: Received from: [facility]  . COENZYME Q-10 PO Take by mouth. 06/19/2015: Received from: [facility]  . omeprazole (PRILOSEC) 20 MG capsule Take 1 capsule (20 mg total) by mouth daily.   . rosuvastatin (CRESTOR) 10 MG tablet TAKE 1 TABLET (10 MG TOTAL) BY MOUTH DAILY.   . [DISCONTINUED] naproxen (NAPROSYN) 500 MG tablet Take 1 tablet (500 mg total) by mouth 2 (two) times daily with a meal.    No facility-administered encounter medications on file as of 01/10/2017.     Patient Care Team: Maple Hudson., MD as PCP - General (Family Medicine)      Objective:   Vitals:  Vitals:   01/10/17 1414  BP: 108/62  Pulse: 78  Resp: 14  Weight: 166 lb (75.3 kg)  Height: 5' 0.75" (1.543 m)    Physical Exam  Constitutional: She is oriented to person, place, and time. She appears well-developed and well-nourished.  HENT:  Head:  Normocephalic and atraumatic.  Right Ear: External ear normal.  Left Ear: External ear normal.  Mouth/Throat: Oropharynx is clear and moist.  Eyes: Conjunctivae are normal. Pupils are equal, round, and reactive to light.  Neck: Normal range of motion. Neck supple.  Cardiovascular: Normal rate, regular rhythm, normal heart sounds and intact distal pulses.  Exam reveals no gallop.   No murmur heard. Pulmonary/Chest: Effort normal and breath sounds normal. No respiratory distress. She has no wheezes.  Abdominal: Soft. She exhibits no distension.  There is no tenderness.  Musculoskeletal: She exhibits no edema or tenderness.  Neurological: She is alert and oriented to person, place, and time. No cranial nerve deficit. Coordination normal.  Skin: No rash noted. No erythema.  Psychiatric: She has a normal mood and affect. Her behavior is normal. Judgment and thought content normal.     Depression Screen PHQ 2/9 Scores 01/10/2017 01/10/2017 12/31/2015  PHQ - 2 Score 0 0 0  PHQ- 9 Score 1 - -   Assessment & Plan:   1. Annual physical exam - patient sees Dr Feliberto GottronSchermerhorn for gyn check up. CBC with Differential/Platelet - Comprehensive metabolic panel - TSH  2. Essential (primary) hypertension Stable. Refill given - CBC with Differential/Platelet - Comprehensive metabolic panel - TSH - bisoprolol-hydrochlorothiazide (ZIAC) 5-6.25 MG tablet; Take 1 tablet by mouth daily.  Dispense: 90 tablet; Refill: 3  3. Gastroesophageal reflux disease, esophagitis presence not specified Refill given. Patient is not able to get off the medication. Symptom are controlled on the medication. Needs daily Omeprazole. - TSH - omeprazole (PRILOSEC) 20 MG capsule; Take 1 capsule (20 mg total) by mouth daily.  Dispense: 90 capsule; Refill: 3  4. Hypercholesteremia refill given. - Comprehensive metabolic panel - Lipid Panel With LDL/HDL Ratio - rosuvastatin (CRESTOR) 10 MG tablet; Take 1 tablet (10 mg total) by mouth daily.  Dispense: 90 tablet; Refill: 3  5. Other seasonal allergic rhinitis Stable.  HPI, Exam and A&P transcribed by Samara DeistAnastasiya Aleksandrova, RMA under direction and in the presence of Julieanne Mansonichard Gilbert, MD.    Discussed health benefits of physical activity, and encouraged her to engage in regular exercise appropriate for her age and condition.   I have done the exam and reviewed the chart and it is accurate to the best of my knowledge. [employer]  technology has been used and  any errors in dictation or transcription are  unintentional. Julieanne Mansonichard Gilbert M.D. [facility]

## 2017-01-19 ENCOUNTER — Other Ambulatory Visit: Payer: Self-pay | Admitting: Family Medicine

## 2017-01-19 DIAGNOSIS — I10 Essential (primary) hypertension: Secondary | ICD-10-CM

## 2017-01-28 LAB — CBC WITH DIFFERENTIAL/PLATELET
BASOS: 2 %
Basophils Absolute: 0.1 10*3/uL (ref 0.0–0.2)
EOS (ABSOLUTE): 0.5 10*3/uL — ABNORMAL HIGH (ref 0.0–0.4)
EOS: 5 %
Hematocrit: 34 % (ref 34.0–46.6)
Hemoglobin: 11.1 g/dL (ref 11.1–15.9)
Immature Grans (Abs): 0 10*3/uL (ref 0.0–0.1)
Immature Granulocytes: 0 %
LYMPHS ABS: 2.3 10*3/uL (ref 0.7–3.1)
Lymphs: 24 %
MCH: 27.4 pg (ref 26.6–33.0)
MCHC: 32.6 g/dL (ref 31.5–35.7)
MCV: 84 fL (ref 79–97)
MONOS ABS: 0.7 10*3/uL (ref 0.1–0.9)
Monocytes: 7 %
NEUTROS ABS: 5.9 10*3/uL (ref 1.4–7.0)
Neutrophils: 62 %
Platelets: 351 10*3/uL (ref 150–379)
RBC: 4.05 x10E6/uL (ref 3.77–5.28)
RDW: 13.4 % (ref 12.3–15.4)
WBC: 9.5 10*3/uL (ref 3.4–10.8)

## 2017-01-28 LAB — COMPREHENSIVE METABOLIC PANEL
A/G RATIO: 1.5 (ref 1.2–2.2)
ALBUMIN: 4.5 g/dL (ref 3.5–5.5)
ALK PHOS: 104 IU/L (ref 39–117)
ALT: 20 IU/L (ref 0–32)
AST: 19 IU/L (ref 0–40)
BUN / CREAT RATIO: 15 (ref 9–23)
BUN: 11 mg/dL (ref 6–24)
Bilirubin Total: 0.3 mg/dL (ref 0.0–1.2)
CO2: 25 mmol/L (ref 18–29)
Calcium: 9.8 mg/dL (ref 8.7–10.2)
Chloride: 100 mmol/L (ref 96–106)
Creatinine, Ser: 0.75 mg/dL (ref 0.57–1.00)
GFR calc Af Amer: 107 mL/min/{1.73_m2} (ref >59)
GFR calc non Af Amer: 93 mL/min/{1.73_m2} (ref >59)
Globulin, Total: 3 g/dL (ref 1.5–4.5)
Glucose: 95 mg/dL (ref 65–99)
POTASSIUM: 4.6 mmol/L (ref 3.5–5.2)
SODIUM: 142 mmol/L (ref 134–144)
Total Protein: 7.5 g/dL (ref 6.0–8.5)

## 2017-01-28 LAB — TSH: TSH: 2.21 u[IU]/mL (ref 0.450–4.500)

## 2017-01-28 LAB — LIPID PANEL WITH LDL/HDL RATIO
CHOLESTEROL TOTAL: 164 mg/dL (ref 100–199)
HDL: 44 mg/dL (ref >39)
LDL Calculated: 86 mg/dL (ref 0–99)
LDL/HDL RATIO: 2 ratio (ref 0.0–3.2)
TRIGLYCERIDES: 168 mg/dL — AB (ref 0–149)
VLDL Cholesterol Cal: 34 mg/dL (ref 5–40)

## 2017-02-14 ENCOUNTER — Other Ambulatory Visit: Payer: Self-pay | Admitting: Family Medicine

## 2017-02-14 DIAGNOSIS — K219 Gastro-esophageal reflux disease without esophagitis: Secondary | ICD-10-CM

## 2017-03-23 ENCOUNTER — Encounter: Payer: Self-pay | Admitting: *Deleted

## 2017-03-24 ENCOUNTER — Encounter: Payer: Self-pay | Admitting: *Deleted

## 2017-03-24 ENCOUNTER — Ambulatory Visit: Payer: BLUE CROSS/BLUE SHIELD | Admitting: Certified Registered"

## 2017-03-24 ENCOUNTER — Ambulatory Visit
Admission: RE | Admit: 2017-03-24 | Discharge: 2017-03-24 | Disposition: A | Discharge status: Home / Self Care | Payer: BLUE CROSS/BLUE SHIELD | Source: Ambulatory Visit | Attending: Unknown Physician Specialty | Admitting: Unknown Physician Specialty

## 2017-03-24 ENCOUNTER — Encounter
Admission: RE | Disposition: A | Discharge status: Home / Self Care | Payer: Self-pay | Source: Ambulatory Visit | Attending: Unknown Physician Specialty

## 2017-03-24 DIAGNOSIS — Z833 Family history of diabetes mellitus: Secondary | ICD-10-CM | POA: Insufficient documentation

## 2017-03-24 DIAGNOSIS — E7800 Pure hypercholesterolemia, unspecified: Secondary | ICD-10-CM | POA: Insufficient documentation

## 2017-03-24 DIAGNOSIS — F419 Anxiety disorder, unspecified: Secondary | ICD-10-CM | POA: Insufficient documentation

## 2017-03-24 DIAGNOSIS — Z91012 Allergy to eggs: Secondary | ICD-10-CM | POA: Insufficient documentation

## 2017-03-24 DIAGNOSIS — J310 Chronic rhinitis: Secondary | ICD-10-CM | POA: Insufficient documentation

## 2017-03-24 DIAGNOSIS — Z1211 Encounter for screening for malignant neoplasm of colon: Secondary | ICD-10-CM | POA: Insufficient documentation

## 2017-03-24 DIAGNOSIS — Z7982 Long term (current) use of aspirin: Secondary | ICD-10-CM | POA: Diagnosis not present

## 2017-03-24 DIAGNOSIS — Z8249 Family history of ischemic heart disease and other diseases of the circulatory system: Secondary | ICD-10-CM | POA: Insufficient documentation

## 2017-03-24 DIAGNOSIS — Z818 Family history of other mental and behavioral disorders: Secondary | ICD-10-CM | POA: Insufficient documentation

## 2017-03-24 DIAGNOSIS — D6859 Other primary thrombophilia: Secondary | ICD-10-CM | POA: Insufficient documentation

## 2017-03-24 DIAGNOSIS — K621 Rectal polyp: Secondary | ICD-10-CM | POA: Diagnosis not present

## 2017-03-24 DIAGNOSIS — I10 Essential (primary) hypertension: Secondary | ICD-10-CM | POA: Diagnosis not present

## 2017-03-24 DIAGNOSIS — Z79899 Other long term (current) drug therapy: Secondary | ICD-10-CM | POA: Insufficient documentation

## 2017-03-24 DIAGNOSIS — Z880 Allergy status to penicillin: Secondary | ICD-10-CM | POA: Insufficient documentation

## 2017-03-24 DIAGNOSIS — K640 First degree hemorrhoids: Secondary | ICD-10-CM | POA: Insufficient documentation

## 2017-03-24 DIAGNOSIS — K219 Gastro-esophageal reflux disease without esophagitis: Secondary | ICD-10-CM | POA: Diagnosis not present

## 2017-03-24 HISTORY — DX: Anxiety disorder, unspecified: F41.9

## 2017-03-24 HISTORY — DX: Other primary thrombophilia: D68.59

## 2017-03-24 HISTORY — PX: COLONOSCOPY WITH PROPOFOL: SHX5780

## 2017-03-24 HISTORY — DX: Chronic rhinitis: J31.0

## 2017-03-24 HISTORY — DX: Pure hypercholesterolemia, unspecified: E78.00

## 2017-03-24 HISTORY — DX: Gastro-esophageal reflux disease without esophagitis: K21.9

## 2017-03-24 HISTORY — DX: Essential (primary) hypertension: I10

## 2017-03-24 LAB — POCT PREGNANCY, URINE: Preg Test, Ur: NEGATIVE

## 2017-03-24 SURGERY — COLONOSCOPY WITH PROPOFOL
Anesthesia: General

## 2017-03-24 MED ORDER — PROPOFOL 500 MG/50ML IV EMUL
INTRAVENOUS | Status: DC | PRN
  Administered 2017-03-24: 150 ug/kg/min via INTRAVENOUS

## 2017-03-24 MED ORDER — LIDOCAINE HCL (CARDIAC) 20 MG/ML IV SOLN
INTRAVENOUS | Status: DC | PRN
  Administered 2017-03-24: 50 mg via INTRAVENOUS

## 2017-03-24 MED ORDER — SODIUM CHLORIDE 0.9 % IV SOLN
INTRAVENOUS | Status: DC
  Administered 2017-03-24: 13:00:00 via INTRAVENOUS

## 2017-03-24 MED ORDER — PROPOFOL 10 MG/ML IV BOLUS
INTRAVENOUS | Status: DC | PRN
  Administered 2017-03-24: 60 mg via INTRAVENOUS

## 2017-03-24 MED ORDER — PROPOFOL 10 MG/ML IV BOLUS
INTRAVENOUS | Status: AC
  Filled 2017-03-24: qty 20

## 2017-03-24 MED ORDER — SODIUM CHLORIDE 0.9 % IV SOLN: INTRAVENOUS | Status: DC

## 2017-03-24 NOTE — Anesthesia Post-op Follow-up Note (Cosign Needed)
Anesthesia QCDR form completed.        

## 2017-03-24 NOTE — Anesthesia Procedure Notes (Signed)
Performed by: Elby Blackwelder Pre-anesthesia Checklist: Patient identified, Emergency Drugs available, Suction available, Patient being monitored and Timeout performed Patient Re-evaluated:Patient Re-evaluated prior to induction Oxygen Delivery Method: Nasal cannula Preoxygenation: Pre-oxygenation with 100% oxygen Induction Type: IV induction       

## 2017-03-24 NOTE — Op Note (Signed)
[facility] Gastroenterology Patient Name: Karen Henry Procedure Date: 03/24/2017 1:21 PM MRN: [id number] Account #: [id number] Date of Birth: [birth year] Admit Type: Outpatient Age: [age] Room: [facility] ENDO ROOM 1 Gender: Female Note Status: Finalized Procedure:            Colonoscopy Indications:          Screening for colorectal malignant neoplasm Providers:            Scot Junobert T. Elliott, MD Referring MD:         Ferdinand Langoichard L. Sullivan LoneGilbert, MD (Referring MD) Medicines:            Propofol per Anesthesia Complications:        No immediate complications. Procedure:            Pre-Anesthesia Assessment:                       - After reviewing the risks and benefits, the patient                        was deemed in satisfactory condition to undergo the                        procedure.                       After obtaining informed consent, the colonoscope was                        passed under direct vision. Throughout the procedure,                        the patient's blood pressure, pulse, and oxygen                        saturations were monitored continuously. The                        Colonoscope was introduced through the anus and                        advanced to the the cecum, identified by appendiceal                        orifice and ileocecal valve. The colonoscopy was                        performed without difficulty. The patient tolerated the                        procedure well. The quality of the bowel preparation                        was excellent. Findings:      A diminutive polyp was found in the rectum. The polyp was sessile. The       polyp was removed with a jumbo cold forceps. Resection and retrieval       were complete.      Internal hemorrhoids were found during endoscopy. The hemorrhoids were       small and Grade I (internal hemorrhoids that do not prolapse).      The exam was  otherwise without abnormality. Impression:           - One  diminutive polyp in the rectum, removed with a                        jumbo cold forceps. Resected and retrieved.                       - Internal hemorrhoids.                       - The examination was otherwise normal. Recommendation:       - Await pathology results. Scot Junobert T Elliott, MD 03/24/2017 1:53:34 PM This report has been signed electronically. Number of Addenda: 0 Note Initiated On: 03/24/2017 1:21 PM Scope Withdrawal Time: 0 hours 15 minutes 18 seconds  Total Procedure Duration: 0 hours 23 minutes 12 seconds       [facility]

## 2017-03-24 NOTE — Anesthesia Preprocedure Evaluation (Signed)
Anesthesia Evaluation  Patient identified by MRN, date of birth, ID band Patient awake    Reviewed: Allergy & Precautions, H&P , NPO status , Patient's Chart, lab work & pertinent test results  History of Anesthesia Complications Negative for: history of anesthetic complications  Airway Mallampati: III  TM Distance: >3 FB Neck ROM: full    Dental  (+) Chipped   Pulmonary neg pulmonary ROS, neg shortness of breath,           Cardiovascular Exercise Tolerance: Good hypertension, (-) angina(-) Past MI and (-) DOE      Neuro/Psych PSYCHIATRIC DISORDERS Anxiety negative neurological ROS     GI/Hepatic Neg liver ROS, GERD  Controlled and Medicated,  Endo/Other  negative endocrine ROS  Renal/GU negative Renal ROS  negative genitourinary   Musculoskeletal   Abdominal   Peds  Hematology negative hematology ROS (+)   Anesthesia Other Findings Past Medical History: No date: Anxiety No date: GERD (gastroesophageal reflux disease) No date: High blood cholesterol level No date: Hypercoagulable state (HCC) No date: Hypertension No date: Rhinitis  Past Surgical History: No date: TUBAL LIGATION  BMI    Body Mass Index:  28.35 kg/m      Reproductive/Obstetrics negative OB ROS                             Anesthesia Physical Anesthesia Plan  ASA: III  Anesthesia Plan: General   Post-op Pain Management:    Induction: Intravenous  PONV Risk Score and Plan:   Airway Management Planned: Natural Airway and Nasal Cannula  Additional Equipment:   Intra-op Plan:   Post-operative Plan:   Informed Consent: I have reviewed the patients History and Physical, chart, labs and discussed the procedure including the risks, benefits and alternatives for the proposed anesthesia with the patient or authorized representative who has indicated his/her understanding and acceptance.   Dental Advisory  Given  Plan Discussed with: Anesthesiologist, CRNA and Surgeon  Anesthesia Plan Comments: (Patient reports no past problems with propofol.  She can eat cooked eggs without any problems.  Per AAAAI recommendations: Soy-Allergic and Egg-Allergic Patients Can Safely Receive Anesthesia, plan to proceed with case utilizing propofol as patient endorses no prior reactions to propofol.   Patient consented for risks of anesthesia including but not limited to:  - adverse reactions to medications - risk of intubation if required - damage to teeth, lips or other oral mucosa - sore throat or hoarseness - Damage to heart, brain, lungs or loss of life  Patient voiced understanding.)        Anesthesia Quick Evaluation

## 2017-03-24 NOTE — Transfer of Care (Signed)
Immediate Anesthesia Transfer of Care Note  Patient: Dailee T Macken  Procedure(s) Performed: Procedure(s): COLONOSCOPY WITH PROPOFOL (N/A)  Patient Location: PACU  Anesthesia Type:General  Level of Consciousness: sedated and responds to stimulation  Airway & Oxygen Therapy: Patient Spontanous Breathing and Patient connected to nasal cannula oxygen  Post-op Assessment: Report given to RN and Post -op Vital signs reviewed and stable  Post vital signs: Reviewed and stable  Last Vitals:  Vitals:   03/24/17 1354 03/24/17 1356  BP:  (!) 90/57  Pulse: 74 72  Resp: 11 10  Temp: (!) 35.6 C     Last Pain:  Vitals:   03/24/17 1354  TempSrc: Tympanic         Complications: No apparent anesthesia complications

## 2017-03-24 NOTE — H&P (Signed)
   Primary Care Physician:  Maple HudsonGilbert, Richard L Jr., MD Primary Gastroenterologist:  Dr. Mechele CollinElliott  Pre-Procedure History & Physical: HPI:  Karen Henry is a 51 y.o. female is here for an colonoscopy.   Past Medical History:  Diagnosis Date  . Anxiety   . GERD (gastroesophageal reflux disease)   . High blood cholesterol level   . Hypercoagulable state (HCC)   . Hypertension   . Rhinitis     Past Surgical History:  Procedure Laterality Date  . TUBAL LIGATION      Prior to Admission medications   Medication Sig Start Date End Date Taking? Authorizing Provider  aspirin 81 MG tablet Take by mouth.   Yes [provider]  Bioflavonoid Products (VITAMIN C) CHEW Chew by mouth.   Yes [provider]  bisoprolol-hydrochlorothiazide (ZIAC) 5-6.25 MG tablet Take 1 tablet by mouth daily. 01/10/17  Yes Maple HudsonGilbert, Richard L Jr., MD  Cetirizine HCl (ZYRTEC ALLERGY) 10 MG CAPS Take by mouth.   Yes [provider]  COENZYME Q-10 PO Take by mouth. 07/06/11  Yes [provider]  omeprazole (PRILOSEC) 20 MG capsule Take 1 capsule (20 mg total) by mouth daily. 01/10/17  Yes Maple HudsonGilbert, Richard L Jr., MD  rosuvastatin (CRESTOR) 10 MG tablet Take 1 tablet (10 mg total) by mouth daily. 01/10/17  Yes Maple HudsonGilbert, Richard L Jr., MD  bisoprolol-hydrochlorothiazide [facility](ZIAC) 5-6.25 MG tablet TAKE 1 TABLET BY MOUTH DAILY. 01/19/17   Maple HudsonGilbert, Richard L Jr., MD    Allergies as of 01/11/2017 - Review Complete 01/10/2017  Allergen Reaction Noted  . Eggs or egg-derived products Itching 11/23/2016  . Penicillins Rash 06/19/2015    Family History  Problem Relation Age of Onset  . Hypertension Mother   . Hypertension Father   . Heart disease Father   . Diabetes Brother   . ADD / ADHD Son     Social History   Social History  . Marital status: Married    Spouse name: N/A  . Number of children: N/A  . Years of education: N/A   Occupational History  . Not on file.   Social History  Main Topics  . Smoking status: Never Smoker  . Smokeless tobacco: Never Used  . Alcohol use Yes     Comment: social  . Drug use: No  . Sexual activity: Not on file   Other Topics Concern  . Not on file   Social History Narrative  . No narrative on file    Review of Systems: See HPI, otherwise negative ROS  Physical Exam: BP 125/74   Pulse 75   Temp 97.9 F (36.6 C) (Tympanic)   Resp 18   Ht 5\' 2"  (1.575 m)   Wt 70.3 kg (155 lb)   SpO2 100%   BMI 28.35 kg/m  General:   Alert,  pleasant and cooperative in NAD Head:  Normocephalic and atraumatic. Neck:  Supple; no masses or thyromegaly. Lungs:  Clear throughout to auscultation.    Heart:  Regular rate and rhythm. Abdomen:  Soft, nontender and nondistended. Normal bowel sounds, without guarding, and without rebound.   Neurologic:  Alert and  oriented x4;  grossly normal neurologically.  Impression/Plan: Karen Henry is here for an colonoscopy to be performed for screening colonoscopy  Risks, benefits, limitations, and alternatives regarding  colonoscopy have been reviewed with the patient.  Questions have been answered.  All parties agreeable.   Lynnae PrudeELLIOTT, Ciel Chervenak, MD  03/24/2017, 1:18 PM

## 2017-03-27 ENCOUNTER — Encounter: Payer: Self-pay | Admitting: Unknown Physician Specialty

## 2017-03-27 LAB — SURGICAL PATHOLOGY

## 2017-03-27 NOTE — Anesthesia Postprocedure Evaluation (Signed)
Anesthesia Post Note  Patient: Karen Henry  Procedure(s) Performed: Procedure(s) (LRB): COLONOSCOPY WITH PROPOFOL (N/A)  Patient location during evaluation: PACU Anesthesia Type: General Level of consciousness: awake and alert and oriented Pain management: pain level controlled Vital Signs Assessment: post-procedure vital signs reviewed and stable Respiratory status: spontaneous breathing Cardiovascular status: blood pressure returned to baseline Anesthetic complications: no     Last Vitals:  Vitals:   03/24/17 1424 03/24/17 1434  BP: 109/68   Pulse: 66 66  Resp: 14 18  Temp:      Last Pain:  Vitals:   03/24/17 1354  TempSrc: Tympanic                 Khrystyne Arpin

## 2017-05-05 ENCOUNTER — Encounter: Payer: Self-pay | Admitting: Family Medicine

## 2017-10-05 ENCOUNTER — Other Ambulatory Visit: Payer: Self-pay | Admitting: Obstetrics and Gynecology

## 2017-10-05 DIAGNOSIS — Z1231 Encounter for screening mammogram for malignant neoplasm of breast: Secondary | ICD-10-CM

## 2017-10-25 ENCOUNTER — Ambulatory Visit
Admission: RE | Admit: 2017-10-25 | Discharge: 2017-10-25 | Disposition: A | Discharge status: Home / Self Care | Payer: BLUE CROSS/BLUE SHIELD | Source: Ambulatory Visit | Attending: Obstetrics and Gynecology | Admitting: Obstetrics and Gynecology

## 2017-10-25 DIAGNOSIS — Z1231 Encounter for screening mammogram for malignant neoplasm of breast: Secondary | ICD-10-CM | POA: Insufficient documentation

## 2018-01-11 ENCOUNTER — Encounter: Payer: Self-pay | Admitting: Family Medicine

## 2018-01-14 ENCOUNTER — Other Ambulatory Visit: Payer: Self-pay | Admitting: Family Medicine

## 2018-01-14 DIAGNOSIS — K219 Gastro-esophageal reflux disease without esophagitis: Secondary | ICD-10-CM

## 2018-01-15 ENCOUNTER — Ambulatory Visit: Payer: BLUE CROSS/BLUE SHIELD | Admitting: Family Medicine

## 2018-01-15 ENCOUNTER — Encounter: Payer: Self-pay | Admitting: Family Medicine

## 2018-01-15 VITALS — BP 118/72 | HR 72 | Temp 98.6°F | Resp 14 | Wt 168.0 lb

## 2018-01-15 DIAGNOSIS — E7800 Pure hypercholesterolemia, unspecified: Secondary | ICD-10-CM | POA: Diagnosis not present

## 2018-01-15 DIAGNOSIS — K219 Gastro-esophageal reflux disease without esophagitis: Secondary | ICD-10-CM

## 2018-01-15 DIAGNOSIS — J301 Allergic rhinitis due to pollen: Secondary | ICD-10-CM | POA: Diagnosis not present

## 2018-01-15 DIAGNOSIS — M722 Plantar fascial fibromatosis: Secondary | ICD-10-CM

## 2018-01-15 DIAGNOSIS — I10 Essential (primary) hypertension: Secondary | ICD-10-CM | POA: Diagnosis not present

## 2018-01-15 MED ORDER — NAPROXEN 500 MG PO TBEC: 500.0000 mg | DELAYED_RELEASE_TABLET | Freq: Two times a day (BID) | ORAL | 3 refills | Status: DC

## 2018-01-15 NOTE — Patient Instructions (Signed)

## 2018-01-15 NOTE — Progress Notes (Signed)
Patient: Karen Henry Female    DOB: Feb 15, 1966   52 y.o.   MRN: [id number] Visit Date: 01/15/2018  Today's Provider: Megan Mans, MD   Chief Complaint  Patient presents with  . Hypertension  . Hyperlipidemia  . Gastroesophageal Reflux   Subjective:    HPI  Hypertension, follow-up:  BP Readings from Last 3 Encounters:  01/15/18 118/72  03/24/17 109/68  01/10/17 108/62    She was last seen for hypertension 1 years ago.  BP at that visit was 109/68. Management since that visit includes none. She reports good compliance with treatment. She is not having side effects.  She is exercising. She is adherent to low salt diet.   Outside blood pressures are not being checked. Patient denies chest pain, chest pressure/discomfort, claudication, dyspnea, exertional chest pressure/discomfort, fatigue, irregular heart beat, lower extremity edema, near-syncope, orthopnea, palpitations, paroxysmal nocturnal dyspnea, syncope and tachypnea.    Wt Readings from Last 3 Encounters:  01/15/18 168 lb (76.2 kg)  03/24/17 155 lb (70.3 kg)  01/10/17 166 lb (75.3 kg)   ------------------------------------------------------------------------      Allergies  Allergen Reactions  . Eggs Or Egg-Derived Products Itching  . Penicillins Rash     Current Outpatient Medications:  .  aspirin 81 MG tablet, Take by mouth., Disp: , Rfl:  .  Bioflavonoid Products (VITAMIN C) CHEW, Chew by mouth., Disp: , Rfl:  .  bisoprolol-hydrochlorothiazide (ZIAC) 5-6.25 MG tablet, Take 1 tablet by mouth daily., Disp: 90 tablet, Rfl: 3 .  Cetirizine HCl (ZYRTEC ALLERGY) 10 MG CAPS, Take by mouth., Disp: , Rfl:  .  COENZYME Q-10 PO, Take by mouth., Disp: , Rfl:  .  rosuvastatin (CRESTOR) 10 MG tablet, Take 1 tablet (10 mg total) by mouth daily., Disp: 90 tablet, Rfl: 3 .  bisoprolol-hydrochlorothiazide (ZIAC) 5-6.25 MG tablet, TAKE 1 TABLET BY MOUTH DAILY., Disp: 90 tablet, Rfl: 3 .  omeprazole  (PRILOSEC) 20 MG capsule, TAKE 1 CAPSULE BY MOUTH EVERY DAY (Patient not taking: Reported on 01/15/2018), Disp: 90 capsule, Rfl: 3  Review of Systems  Constitutional: Negative.   HENT: Positive for congestion.   Eyes: Negative.   Respiratory: Negative.   Cardiovascular: Negative.   Gastrointestinal: Negative.   Endocrine: Negative.   Genitourinary: Negative.   Musculoskeletal: Negative.        Foot pain with walking.  Skin: Negative.   Allergic/Immunologic: Negative.   Neurological: Negative.   Hematological: Negative.   Psychiatric/Behavioral: Negative.     Social History   Tobacco Use  . Smoking status: Never Smoker  . Smokeless tobacco: Never Used  Substance Use Topics  . Alcohol use: Yes    Comment: social   Objective:   BP 118/72 (BP Location: Left Arm, Patient Position: Sitting, Cuff Size: Normal)   Pulse 72   Temp 98.6 F (37 C) (Oral)   Resp 14   Wt 168 lb (76.2 kg)   BMI 30.73 kg/m  Vitals:   01/15/18 1546  BP: 118/72  Pulse: 72  Resp: 14  Temp: 98.6 F (37 C)  TempSrc: Oral  Weight: 168 lb (76.2 kg)     Physical Exam  Constitutional: She is oriented to person, place, and time. She appears well-developed and well-nourished.  HENT:  Head: Normocephalic and atraumatic.  Right Ear: External ear normal.  Left Ear: External ear normal.  Nose: Nose normal.  Mouth/Throat: Oropharynx is clear and moist.  Eyes: Pupils are equal, round, and reactive to light.  Conjunctivae are normal. No scleral icterus.  Neck: No thyromegaly present.  Cardiovascular: Normal rate, regular rhythm and normal heart sounds.  Pulmonary/Chest: Effort normal and breath sounds normal.  Abdominal: Soft.  Musculoskeletal: She exhibits no edema.  Neurological: She is alert and oriented to person, place, and time.  Skin: Skin is warm and dry.  Psychiatric: She has a normal mood and affect. Her behavior is normal. Judgment and thought content normal.        Assessment & Plan:      1. Allergic rhinitis due to pollen, unspecified seasonality  Consider adding Singulair.  2. Plantar fasciitis May need podiatry referral. - naproxen (EC NAPROSYN) 500 MG EC tablet; Take 1 tablet (500 mg total) by mouth 2 (two) times daily with a meal.  Dispense: 60 tablet; Refill: 3  3. Essential (primary) hypertension   4. Gastroesophageal reflux disease, esophagitis presence not specified   5. Hypercholesteremia Check labs.CPE 1 year.     I have done the exam and reviewed the chart and it is accurate to the best of my knowledge. Dentist has been used and  any errors in dictation or transcription are unintentional. Julieanne Manson M.D. [facility]   Megan Mans, MD  [facility]

## 2018-01-31 ENCOUNTER — Other Ambulatory Visit: Payer: Self-pay | Admitting: Family Medicine

## 2018-01-31 DIAGNOSIS — E7800 Pure hypercholesterolemia, unspecified: Secondary | ICD-10-CM

## 2018-08-18 ENCOUNTER — Other Ambulatory Visit: Payer: Self-pay | Admitting: Family Medicine

## 2018-10-10 ENCOUNTER — Other Ambulatory Visit: Payer: Self-pay | Admitting: Obstetrics and Gynecology

## 2018-10-10 DIAGNOSIS — Z1231 Encounter for screening mammogram for malignant neoplasm of breast: Secondary | ICD-10-CM

## 2018-10-26 ENCOUNTER — Ambulatory Visit
Admission: RE | Admit: 2018-10-26 | Discharge: 2018-10-26 | Disposition: A | Discharge status: Home / Self Care | Payer: BLUE CROSS/BLUE SHIELD | Source: Ambulatory Visit | Attending: Obstetrics and Gynecology | Admitting: Obstetrics and Gynecology

## 2018-10-26 DIAGNOSIS — Z1231 Encounter for screening mammogram for malignant neoplasm of breast: Secondary | ICD-10-CM

## 2019-01-04 ENCOUNTER — Telehealth: Payer: Self-pay

## 2019-01-04 NOTE — Telephone Encounter (Signed)
LMTCB foe PHQ screening

## 2019-01-17 ENCOUNTER — Encounter: Payer: Self-pay | Admitting: Family Medicine

## 2019-02-06 ENCOUNTER — Other Ambulatory Visit: Payer: Self-pay | Admitting: Family Medicine

## 2019-02-06 DIAGNOSIS — E7800 Pure hypercholesterolemia, unspecified: Secondary | ICD-10-CM

## 2019-02-06 DIAGNOSIS — K219 Gastro-esophageal reflux disease without esophagitis: Secondary | ICD-10-CM

## 2019-02-06 NOTE — Telephone Encounter (Signed)
Please review

## 2019-02-25 ENCOUNTER — Ambulatory Visit: Payer: BC Managed Care – PPO | Admitting: Family Medicine

## 2019-02-25 ENCOUNTER — Encounter: Payer: Self-pay | Admitting: Family Medicine

## 2019-02-25 ENCOUNTER — Other Ambulatory Visit: Payer: Self-pay

## 2019-02-25 VITALS — BP 110/74 | HR 76 | Temp 98.1°F | Resp 16 | Wt 159.8 lb

## 2019-02-25 DIAGNOSIS — Z01419 Encounter for gynecological examination (general) (routine) without abnormal findings: Secondary | ICD-10-CM

## 2019-02-25 DIAGNOSIS — L309 Dermatitis, unspecified: Secondary | ICD-10-CM | POA: Diagnosis not present

## 2019-02-25 DIAGNOSIS — I10 Essential (primary) hypertension: Secondary | ICD-10-CM | POA: Diagnosis not present

## 2019-02-25 DIAGNOSIS — E7800 Pure hypercholesterolemia, unspecified: Secondary | ICD-10-CM | POA: Diagnosis not present

## 2019-02-25 MED ORDER — TRIAMCINOLONE ACETONIDE 0.1 % EX CREA: 1.0000 "application " | TOPICAL_CREAM | Freq: Two times a day (BID) | CUTANEOUS | 1 refills | Status: DC

## 2019-02-25 NOTE — Progress Notes (Signed)
Patient: Karen Henry Female    DOB: 24-Jun-1966   53 y.o.   MRN: [id number] Visit Date: 02/25/2019  Today's Provider: Wilhemena Durie, MD   Chief Complaint  Patient presents with  . Follow-up   Subjective:    1 year follow up. Medication refills. HPI She feels well.  She is taking her medications.Labs due. She does have a nonspecific rash on her face past month or so.  Very small patches of dermatitis.  Mildly pruritic. Allergies  Allergen Reactions  . Eggs Or Egg-Derived Products Itching  . Penicillins Rash     Current Outpatient Medications:  .  aspirin 81 MG tablet, Take by mouth., Disp: , Rfl:  .  Bioflavonoid Products (VITAMIN C) CHEW, Chew by mouth., Disp: , Rfl:  .  bisoprolol-hydrochlorothiazide (ZIAC) 5-6.25 MG tablet, Take 1 tablet by mouth daily., Disp: 90 tablet, Rfl: 3 .  Cetirizine HCl (ZYRTEC ALLERGY) 10 MG CAPS, Take by mouth., Disp: , Rfl:  .  COENZYME Q-10 PO, Take by mouth., Disp: , Rfl:  .  naproxen (EC NAPROSYN) 500 MG EC tablet, Take 1 tablet (500 mg total) by mouth 2 (two) times daily with a meal., Disp: 60 tablet, Rfl: 3 .  omeprazole (PRILOSEC) 20 MG capsule, TAKE 1 CAPSULE BY MOUTH EVERY DAY, Disp: 90 capsule, Rfl: 3 .  rosuvastatin (CRESTOR) 10 MG tablet, TAKE 1 TABLET (10 MG TOTAL) BY MOUTH DAILY., Disp: 90 tablet, Rfl: 3 .  bisoprolol-hydrochlorothiazide (ZIAC) 5-6.25 MG tablet, TAKE 1 TABLET BY MOUTH DAILY. (Patient not taking: Reported on 02/25/2019), Disp: 90 tablet, Rfl: 3 .  bisoprolol-hydrochlorothiazide (ZIAC) 5-6.25 MG tablet, TAKE 1 TABLET BY MOUTH DAILY. (Patient not taking: Reported on 02/25/2019), Disp: 90 tablet, Rfl: 3  Review of Systems  Constitutional: Negative.   HENT: Positive for congestion.   Eyes: Negative.   Respiratory: Negative.   Cardiovascular: Negative.   Gastrointestinal: Negative.   Endocrine: Negative.   Genitourinary: Negative.   Musculoskeletal: Negative.   Skin: Negative.   Allergic/Immunologic:  Negative.   Neurological: Negative.   Hematological: Negative.   Psychiatric/Behavioral: Negative.   All other systems reviewed and are negative.   Social History   Tobacco Use  . Smoking status: Never Smoker  . Smokeless tobacco: Never Used  Substance Use Topics  . Alcohol use: Yes    Comment: social      Objective:   There were no vitals taken for this visit. There were no vitals filed for this visit.   Physical Exam Vitals signs reviewed.  Constitutional:      Appearance: She is well-developed.  HENT:     Head: Normocephalic and atraumatic.     Right Ear: External ear normal.     Left Ear: External ear normal.     Nose: Nose normal.  Eyes:     General: No scleral icterus.    Conjunctiva/sclera: Conjunctivae normal.     Pupils: Pupils are equal, round, and reactive to light.  Neck:     Thyroid: No thyromegaly.  Cardiovascular:     Rate and Rhythm: Normal rate and regular rhythm.     Heart sounds: Normal heart sounds.  Pulmonary:     Effort: Pulmonary effort is normal.     Breath sounds: Normal breath sounds.  Abdominal:     Palpations: Abdomen is soft.  Skin:    General: Skin is warm and dry.     Comments: Several small patches of dermatitis around face--mainly cheeks--1 on  forehead.  Neurological:     Mental Status: She is alert and oriented to person, place, and time.  Psychiatric:        Mood and Affect: Mood normal.        Behavior: Behavior normal.        Thought Content: Thought content normal.        Judgment: Judgment normal.      No results found for any visits on 02/25/19.     Assessment & Plan    1. Dermatitis Derm referral if it worsens. No more than 2 weeks treatment . - triamcinolone cream (KENALOG) 0.1 %; Apply 1 application topically 2 (two) times daily. Apply 1 application 2 times daily to facial patches of dermatitis  Dispense: 15 g; Refill: 1  2. Well woman exam/menopausal symptoms  - FSH/LH - Inhibin A - Lipid Profile -  TSH  3. Hypercholesteremia On crestor.RTC 1 year. - Comp. Metabolic Panel (12)  4. Essential (primary) hypertension On Ziac. - Comp. Metabolic Panel (12) - CBC w/Diff/Platelet     Megan Mansichard Gilbert Jr, MD  [facility]

## 2019-02-26 LAB — COMP. METABOLIC PANEL (12)
AST: 15 IU/L (ref 0–40)
Albumin/Globulin Ratio: 1.5 (ref 1.2–2.2)
Albumin: 4.6 g/dL (ref 3.8–4.9)
Alkaline Phosphatase: 122 IU/L — ABNORMAL HIGH (ref 39–117)
BUN/Creatinine Ratio: 15 (ref 9–23)
BUN: 13 mg/dL (ref 6–24)
Bilirubin Total: 0.3 mg/dL (ref 0.0–1.2)
Calcium: 9.9 mg/dL (ref 8.7–10.2)
Chloride: 100 mmol/L (ref 96–106)
Creatinine, Ser: 0.85 mg/dL (ref 0.57–1.00)
GFR calc Af Amer: 91 mL/min/{1.73_m2} (ref >59)
GFR calc non Af Amer: 79 mL/min/{1.73_m2} (ref >59)
Globulin, Total: 3.1 g/dL (ref 1.5–4.5)
Glucose: 96 mg/dL (ref 65–99)
Potassium: 4.3 mmol/L (ref 3.5–5.2)
Sodium: 143 mmol/L (ref 134–144)
Total Protein: 7.7 g/dL (ref 6.0–8.5)

## 2019-02-26 LAB — CBC WITH DIFFERENTIAL/PLATELET
Basophils Absolute: 0.1 10*3/uL (ref 0.0–0.2)
Basos: 2 %
EOS (ABSOLUTE): 0.4 10*3/uL (ref 0.0–0.4)
Eos: 5 %
Hematocrit: 35.1 % (ref 34.0–46.6)
Hemoglobin: 11.6 g/dL (ref 11.1–15.9)
Immature Grans (Abs): 0 10*3/uL (ref 0.0–0.1)
Immature Granulocytes: 0 %
Lymphocytes Absolute: 2 10*3/uL (ref 0.7–3.1)
Lymphs: 24 %
MCH: 26.7 pg (ref 26.6–33.0)
MCHC: 33 g/dL (ref 31.5–35.7)
MCV: 81 fL (ref 79–97)
Monocytes Absolute: 0.7 10*3/uL (ref 0.1–0.9)
Monocytes: 8 %
Neutrophils Absolute: 5 10*3/uL (ref 1.4–7.0)
Neutrophils: 61 %
Platelets: 346 10*3/uL (ref 150–450)
RBC: 4.35 x10E6/uL (ref 3.77–5.28)
RDW: 13.9 % (ref 11.7–15.4)
WBC: 8.2 10*3/uL (ref 3.4–10.8)

## 2019-02-26 LAB — LIPID PANEL
Chol/HDL Ratio: 4.1 ratio (ref 0.0–4.4)
Cholesterol, Total: 165 mg/dL (ref 100–199)
HDL: 40 mg/dL (ref >39)
LDL Calculated: 98 mg/dL (ref 0–99)
Triglycerides: 137 mg/dL (ref 0–149)
VLDL Cholesterol Cal: 27 mg/dL (ref 5–40)

## 2019-02-26 LAB — TSH: TSH: 1.54 u[IU]/mL (ref 0.450–4.500)

## 2019-02-26 LAB — INHIBIN A: Inhibin A, Ultrasensitive: 0.8 pg/mL

## 2019-02-26 LAB — FSH/LH
FSH: 88.2 m[IU]/mL
LH: 39 m[IU]/mL

## 2019-02-27 ENCOUNTER — Telehealth: Payer: Self-pay

## 2019-02-27 NOTE — Telephone Encounter (Signed)
-----   Message from Jerrol Banana., MD sent at 02/27/2019 11:57 AM EDT ----- Labs stable. Labs show pt postmenopausal.

## 2019-02-27 NOTE — Telephone Encounter (Signed)
Advised 

## 2019-02-27 NOTE — Telephone Encounter (Signed)
LMTCB 02/27/2019  Thanks,   -Erinne Gillentine  

## 2019-08-17 ENCOUNTER — Other Ambulatory Visit: Payer: Self-pay | Admitting: Family Medicine

## 2019-11-14 ENCOUNTER — Other Ambulatory Visit: Payer: Self-pay | Admitting: Obstetrics and Gynecology

## 2019-11-14 DIAGNOSIS — Z1231 Encounter for screening mammogram for malignant neoplasm of breast: Secondary | ICD-10-CM

## 2019-12-03 ENCOUNTER — Ambulatory Visit
Admission: RE | Admit: 2019-12-03 | Discharge: 2019-12-03 | Disposition: A | Discharge status: Home / Self Care | Payer: BC Managed Care – PPO | Source: Ambulatory Visit | Attending: Obstetrics and Gynecology | Admitting: Obstetrics and Gynecology

## 2019-12-03 DIAGNOSIS — Z1231 Encounter for screening mammogram for malignant neoplasm of breast: Secondary | ICD-10-CM | POA: Insufficient documentation

## 2019-12-14 ENCOUNTER — Ambulatory Visit: Payer: BC Managed Care – PPO | Attending: Oncology

## 2019-12-14 DIAGNOSIS — Z23 Encounter for immunization: Secondary | ICD-10-CM

## 2019-12-14 NOTE — Progress Notes (Signed)
   Covid-19 Vaccination Clinic  Name:  Karen Henry    MRN: [id number] DOB: Jul 25, 1966  12/14/2019  [name] was observed post Covid-19 immunization for 15 minutes without incident. She was provided with Vaccine Information Sheet and instruction to access the V-Safe system.   [name] was instructed to call 911 with any severe reactions post vaccine: Marland Kitchen Difficulty breathing  . Swelling of face and throat  . A fast heartbeat  . A bad rash all over body  . Dizziness and weakness   Immunizations Administered    Name Date Dose VIS Date Route   Pfizer COVID-19 Vaccine 12/14/2019  8:43 AM 0.3 mL 08/09/2019 Intramuscular   Manufacturer: [employer], [employer]   Lot: [id number]   NDC: [address]-6

## 2020-01-01 ENCOUNTER — Other Ambulatory Visit: Payer: Self-pay | Admitting: Family Medicine

## 2020-01-01 DIAGNOSIS — M722 Plantar fascial fibromatosis: Secondary | ICD-10-CM

## 2020-01-04 ENCOUNTER — Ambulatory Visit: Payer: BC Managed Care – PPO

## 2020-01-18 ENCOUNTER — Ambulatory Visit: Payer: BC Managed Care – PPO | Attending: Internal Medicine

## 2020-01-18 DIAGNOSIS — Z23 Encounter for immunization: Secondary | ICD-10-CM

## 2020-01-18 NOTE — Progress Notes (Signed)
   Covid-19 Vaccination Clinic  Name:  Karen Henry    MRN: [id number] DOB: Sep 16, 1965  01/18/2020  [name] was observed post Covid-19 immunization for 15 minutes without incident. She was provided with Vaccine Information Sheet and instruction to access the V-Safe system.   [name] was instructed to call 911 with any severe reactions post vaccine: Marland Kitchen Difficulty breathing  . Swelling of face and throat  . A fast heartbeat  . A bad rash all over body  . Dizziness and weakness   Immunizations Administered    Name Date Dose VIS Date Route   Pfizer COVID-19 Vaccine 01/18/2020  8:46 AM 0.3 mL 10/23/2018 Intramuscular   Manufacturer: [employer], [employer]   Lot: [id number]   NDC: [address]-1

## 2020-02-05 ENCOUNTER — Other Ambulatory Visit: Payer: Self-pay | Admitting: Family Medicine

## 2020-02-05 DIAGNOSIS — E7800 Pure hypercholesterolemia, unspecified: Secondary | ICD-10-CM

## 2020-02-05 DIAGNOSIS — K219 Gastro-esophageal reflux disease without esophagitis: Secondary | ICD-10-CM

## 2020-02-05 NOTE — Telephone Encounter (Signed)
Requested Prescriptions  Pending Prescriptions Disp Refills   rosuvastatin (CRESTOR) 10 MG tablet [Pharmacy Med Name: ROSUVASTATIN CALCIUM 10 MG TAB] 90 tablet 0    Sig: TAKE 1 TABLET BY MOUTH EVERY DAY     Cardiovascular:  Antilipid - Statins Passed - 02/05/2020  1:17 AM      Passed - Total Cholesterol in normal range and within 360 days    Cholesterol, Total  Date Value Ref Range Status  02/25/2019 165 100 - 199 mg/dL Final         Passed - LDL in normal range and within 360 days    LDL Calculated  Date Value Ref Range Status  02/25/2019 98 0 - 99 mg/dL Final         Passed - HDL in normal range and within 360 days    HDL  Date Value Ref Range Status  02/25/2019 40 >39 mg/dL Final         Passed - Triglycerides in normal range and within 360 days    Triglycerides  Date Value Ref Range Status  02/25/2019 137 0 - 149 mg/dL Final         Passed - Patient is not pregnant      Passed - Valid encounter within last 12 months    Recent Outpatient Visits          11 months ago Dermatitis   [facility] Maple Hudson., MD   2 years ago Allergic rhinitis due to pollen, unspecified seasonality   [facility] Sullivan Lone, Leonette Monarch., MD   3 years ago Annual physical exam   [facility] Maple Hudson., MD   3 years ago Essential (primary) hypertension   [facility] Maple Hudson., MD   4 years ago Essential (primary) hypertension   [facility] Maple Hudson., MD      Future Appointments            In 2 weeks Maple Hudson., MD [facility]            omeprazole (PRILOSEC) 20 MG capsule [Pharmacy Med Name: OMEPRAZOLE DR 20 MG CAPSULE] 90 capsule 3    Sig: TAKE 1 CAPSULE BY MOUTH EVERY DAY     Gastroenterology: Proton Pump Inhibitors Passed - 02/05/2020  1:17 AM      Passed - Valid encounter within last 12 months    Recent Outpatient Visits           11 months ago Dermatitis   [facility] Maple Hudson., MD   2 years ago Allergic rhinitis due to pollen, unspecified seasonality   [facility] Sullivan Lone, Leonette Monarch., MD   3 years ago Annual physical exam   [facility] Maple Hudson., MD   3 years ago Essential (primary) hypertension   [facility] Maple Hudson., MD   4 years ago Essential (primary) hypertension   [facility] Maple Hudson., MD      Future Appointments            In 2 weeks Maple Hudson., MD [facility]

## 2020-02-21 NOTE — Progress Notes (Signed)
Trena Platt Khalin Royce,acting as a scribe for Wilhemena Durie, MD.,have documented all relevant documentation on the behalf of Wilhemena Durie, MD,as directed by  Wilhemena Durie, MD while in the presence of Wilhemena Durie, MD.  Established patient visit   Patient: Karen Henry   DOB: September 07, 1965   54 y.o. Female  MRN: [id number] Visit Date: 02/25/2020  Today's healthcare provider: Wilhemena Durie, MD   Chief Complaint  Patient presents with   Hyperlipidemia   Hypertension   Subjective    HPI  Patient has no complaints and is tolerating her treatment well.  She sees GYN for well woman care. She needs refills. She does complain of foot pain which bothers her mainly when she first gets up. Hypertension, follow-up  BP Readings from Last 3 Encounters:  02/25/20 109/74  02/25/19 110/74  01/15/18 118/72   Wt Readings from Last 3 Encounters:  02/25/20 162 lb 3.2 oz (73.6 kg)  02/25/19 159 lb 12.8 oz (72.5 kg)  01/15/18 168 lb (76.2 kg)     She was last seen for hypertension 1 years ago.  BP at that visit was 110/74. Management since that visit includes; On Ziac. She reports excellent compliance with treatment. She is not having side effects.  She is exercising. She is adherent to low salt diet.   Outside blood pressures are not being checked.  She does not smoke.  Use of agents associated with hypertension: none.   --------------------------------------------------------------------------------------------------- Lipid/Cholesterol, follow-up  Last Lipid Panel: Lab Results  Component Value Date   CHOL 165 02/25/2019   LDLCALC 98 02/25/2019   HDL 40 02/25/2019   TRIG 137 02/25/2019    She was last seen for this 1 years ago.  Management since that visit includes; On crestor. She reports excellent compliance with treatment. She is not having side effects.  She is following a Regular diet. Current exercise: walking  Last metabolic panel Lab  Results  Component Value Date   GLUCOSE 96 02/25/2019   NA 143 02/25/2019   K 4.3 02/25/2019   BUN 13 02/25/2019   CREATININE 0.85 02/25/2019   GFRNONAA 79 02/25/2019   GFRAA 91 02/25/2019   CALCIUM 9.9 02/25/2019   AST 15 02/25/2019   ALT 20 01/27/2017   The 10-year ASCVD risk score Mikey Bussing DC Jr., et al., 2013) is: 1.8%  ---------------------------------------------------------------------------------------------------  Social History   Tobacco Use   Smoking status: Never Smoker   Smokeless tobacco: Never Used  Vaping Use   Vaping Use: Never used  Substance Use Topics   Alcohol use: Yes    Comment: social   Drug use: No       Medications: Outpatient Medications Prior to Visit  Medication Sig   aspirin 81 MG tablet Take by mouth.   Bioflavonoid Products (VITAMIN C) CHEW Chew by mouth.   bisoprolol-hydrochlorothiazide (ZIAC) 5-6.25 MG tablet Take 1 tablet by mouth daily.   bisoprolol-hydrochlorothiazide (ZIAC) 5-6.25 MG tablet TAKE 1 TABLET BY MOUTH DAILY.   bisoprolol-hydrochlorothiazide (ZIAC) 5-6.25 MG tablet TAKE 1 TABLET BY MOUTH EVERY DAY   Cetirizine HCl (ZYRTEC ALLERGY) 10 MG CAPS Take by mouth.   COENZYME Q-10 PO Take by mouth.   naproxen (EC NAPROSYN) 500 MG EC tablet TAKE 1 TABLET BY MOUTH 2 TIMES DAILY WITH A MEAL.   omeprazole (PRILOSEC) 20 MG capsule TAKE 1 CAPSULE BY MOUTH EVERY DAY   rosuvastatin (CRESTOR) 10 MG tablet TAKE 1 TABLET BY MOUTH EVERY DAY   triamcinolone  cream (KENALOG) 0.1 % Apply 1 application topically 2 (two) times daily. Apply 1 application 2 times daily to facial patches of dermatitis   No facility-administered medications prior to visit.    Review of Systems  Constitutional: Negative for appetite change, chills, fatigue and fever.  Respiratory: Negative for chest tightness and shortness of breath.   Cardiovascular: Negative for chest pain and palpitations.  Gastrointestinal: Negative for abdominal pain, nausea and  vomiting.  Neurological: Negative for dizziness and weakness.  All other systems reviewed and are negative.      Objective    BP 109/74 (BP Location: Left Arm, Patient Position: Sitting, Cuff Size: Large)    Pulse 72    Temp (!) 97.1 F (36.2 C) (Temporal)    Ht 5\' 1"  (1.549 m)    Wt 162 lb 3.2 oz (73.6 kg)    BMI 30.65 kg/m  BP Readings from Last 3 Encounters:  02/25/20 109/74  02/25/19 110/74  01/15/18 118/72   Wt Readings from Last 3 Encounters:  02/25/20 162 lb 3.2 oz (73.6 kg)  02/25/19 159 lb 12.8 oz (72.5 kg)  01/15/18 168 lb (76.2 kg)      Physical Exam Vitals reviewed.  Constitutional:      Appearance: She is well-developed.  HENT:     Head: Normocephalic and atraumatic.     Right Ear: External ear normal.     Left Ear: External ear normal.     Nose: Nose normal.  Eyes:     General: No scleral icterus.    Conjunctiva/sclera: Conjunctivae normal.     Pupils: Pupils are equal, round, and reactive to light.  Neck:     Thyroid: No thyromegaly.  Cardiovascular:     Rate and Rhythm: Normal rate and regular rhythm.     Heart sounds: Normal heart sounds.  Pulmonary:     Effort: Pulmonary effort is normal.     Breath sounds: Normal breath sounds.  Abdominal:     Palpations: Abdomen is soft.  Musculoskeletal:     Comments: Mild tenderness at the plantar insertion to the heel.  Skin:    General: Skin is warm and dry.  Neurological:     General: No focal deficit present.     Mental Status: She is alert and oriented to person, place, and time.  Psychiatric:        Mood and Affect: Mood normal.        Behavior: Behavior normal.        Thought Content: Thought content normal.        Judgment: Judgment normal.       No results found for any visits on 02/25/20.  Assessment & Plan      1. Hypercholesteremia On rosuvastatin. - CBC with Differential/Platelet - Comprehensive metabolic panel - TSH - Lipid panel  2. Essential (primary) hypertension On  Ziac - CBC with Differential/Platelet - Comprehensive metabolic panel - TSH - Lipid panel  3. Plantar fasciitis Treat for a couple of weeks and also tries good support shoes and some plantar fascia stretches - naproxen (EC NAPROSYN) 500 MG EC tablet; TAKE 1 TABLET BY MOUTH 2 TIMES DAILY WITH A MEAL.  Dispense: 60 tablet; Refill: 3  4. Dermatitis Refill cream to use infrequently - triamcinolone cream (KENALOG) 0.1 %; Apply 1 application topically 2 (two) times daily. Apply 1 application 2 times daily to facial patches of dermatitis  Dispense: 15 g; Refill: 1   No follow-ups on file.  I, Megan Mans, MD, have reviewed all documentation for this visit. The documentation on 03/03/20 for the exam, diagnosis, procedures, and orders are all accurate and complete.    Richard Wendelyn Breslow, MD  [facility] [phone] (phone) [phone] (fax)  [facility]

## 2020-02-25 ENCOUNTER — Other Ambulatory Visit: Payer: Self-pay

## 2020-02-25 ENCOUNTER — Encounter: Payer: Self-pay | Admitting: Family Medicine

## 2020-02-25 ENCOUNTER — Ambulatory Visit: Payer: BC Managed Care – PPO | Admitting: Family Medicine

## 2020-02-25 VITALS — BP 109/74 | HR 72 | Temp 97.1°F | Ht 61.0 in | Wt 162.2 lb

## 2020-02-25 DIAGNOSIS — E7800 Pure hypercholesterolemia, unspecified: Secondary | ICD-10-CM | POA: Diagnosis not present

## 2020-02-25 DIAGNOSIS — M722 Plantar fascial fibromatosis: Secondary | ICD-10-CM | POA: Diagnosis not present

## 2020-02-25 DIAGNOSIS — I10 Essential (primary) hypertension: Secondary | ICD-10-CM

## 2020-02-25 DIAGNOSIS — L309 Dermatitis, unspecified: Secondary | ICD-10-CM

## 2020-02-25 MED ORDER — NAPROXEN 500 MG PO TBEC: DELAYED_RELEASE_TABLET | ORAL | 3 refills | Status: DC

## 2020-02-25 MED ORDER — TRIAMCINOLONE ACETONIDE 0.1 % EX CREA: 1.0000 "application " | TOPICAL_CREAM | Freq: Two times a day (BID) | CUTANEOUS | 1 refills | Status: DC

## 2020-02-26 LAB — CBC WITH DIFFERENTIAL/PLATELET
Basophils Absolute: 0.1 10*3/uL (ref 0.0–0.2)
Basos: 2 %
EOS (ABSOLUTE): 0.3 10*3/uL (ref 0.0–0.4)
Eos: 5 %
Hematocrit: 37.3 % (ref 34.0–46.6)
Hemoglobin: 12 g/dL (ref 11.1–15.9)
Immature Grans (Abs): 0 10*3/uL (ref 0.0–0.1)
Immature Granulocytes: 0 %
Lymphocytes Absolute: 2.2 10*3/uL (ref 0.7–3.1)
Lymphs: 30 %
MCH: 26.8 pg (ref 26.6–33.0)
MCHC: 32.2 g/dL (ref 31.5–35.7)
MCV: 83 fL (ref 79–97)
Monocytes Absolute: 0.5 10*3/uL (ref 0.1–0.9)
Monocytes: 7 %
Neutrophils Absolute: 4.1 10*3/uL (ref 1.4–7.0)
Neutrophils: 56 %
Platelets: 325 10*3/uL (ref 150–450)
RBC: 4.48 x10E6/uL (ref 3.77–5.28)
RDW: 13.7 % (ref 11.7–15.4)
WBC: 7.3 10*3/uL (ref 3.4–10.8)

## 2020-02-26 LAB — COMPREHENSIVE METABOLIC PANEL
ALT: 26 IU/L (ref 0–32)
AST: 18 IU/L (ref 0–40)
Albumin/Globulin Ratio: 1.5 (ref 1.2–2.2)
Albumin: 4.6 g/dL (ref 3.8–4.9)
Alkaline Phosphatase: 116 IU/L (ref 48–121)
BUN/Creatinine Ratio: 14 (ref 9–23)
BUN: 12 mg/dL (ref 6–24)
Bilirubin Total: 0.3 mg/dL (ref 0.0–1.2)
CO2: 24 mmol/L (ref 20–29)
Calcium: 9.9 mg/dL (ref 8.7–10.2)
Chloride: 102 mmol/L (ref 96–106)
Creatinine, Ser: 0.84 mg/dL (ref 0.57–1.00)
GFR calc Af Amer: 92 mL/min/{1.73_m2} (ref >59)
GFR calc non Af Amer: 80 mL/min/{1.73_m2} (ref >59)
Globulin, Total: 3 g/dL (ref 1.5–4.5)
Glucose: 95 mg/dL (ref 65–99)
Potassium: 4.1 mmol/L (ref 3.5–5.2)
Sodium: 142 mmol/L (ref 134–144)
Total Protein: 7.6 g/dL (ref 6.0–8.5)

## 2020-02-26 LAB — LIPID PANEL
Chol/HDL Ratio: 4.5 ratio — ABNORMAL HIGH (ref 0.0–4.4)
Cholesterol, Total: 201 mg/dL — ABNORMAL HIGH (ref 100–199)
HDL: 45 mg/dL (ref >39)
LDL Chol Calc (NIH): 137 mg/dL — ABNORMAL HIGH (ref 0–99)
Triglycerides: 107 mg/dL (ref 0–149)
VLDL Cholesterol Cal: 19 mg/dL (ref 5–40)

## 2020-02-26 LAB — TSH: TSH: 1.83 u[IU]/mL (ref 0.450–4.500)

## 2020-05-01 ENCOUNTER — Other Ambulatory Visit: Payer: Self-pay | Admitting: Family Medicine

## 2020-05-01 DIAGNOSIS — E7800 Pure hypercholesterolemia, unspecified: Secondary | ICD-10-CM

## 2020-05-01 DIAGNOSIS — K219 Gastro-esophageal reflux disease without esophagitis: Secondary | ICD-10-CM

## 2020-07-02 ENCOUNTER — Other Ambulatory Visit: Payer: Self-pay | Admitting: Family Medicine

## 2020-08-27 ENCOUNTER — Telehealth: Payer: Self-pay

## 2020-08-27 NOTE — Telephone Encounter (Signed)
Copied from CRM [id number]. Topic: General - Other >> Aug 27, 2020  9:31 AM Gwenlyn Fudge wrote: Reason for CRM: Pt called and is requesting to have a covid test. Pt states that she was in contact with someone who tested positive for covid, and is now experiencing sinus drainage and fever. Pt is requesting to have an appt for today. Please advise.

## 2020-08-27 NOTE — Telephone Encounter (Signed)
Noted. She should call for appointment if needed or if any symptoms develop as advised.

## 2020-08-27 NOTE — Telephone Encounter (Signed)
Karen Henry  Patient states that she went ahead and bought test from store and needed no further testing. KW

## 2020-09-07 ENCOUNTER — Other Ambulatory Visit: Payer: Self-pay

## 2020-09-07 ENCOUNTER — Ambulatory Visit (INDEPENDENT_AMBULATORY_CARE_PROVIDER_SITE_OTHER): Payer: BC Managed Care – PPO | Admitting: Family Medicine

## 2020-09-07 ENCOUNTER — Encounter: Payer: Self-pay | Admitting: Family Medicine

## 2020-09-07 VITALS — BP 130/82 | HR 68 | Temp 98.7°F | Ht 64.0 in | Wt 158.0 lb

## 2020-09-07 DIAGNOSIS — R319 Hematuria, unspecified: Secondary | ICD-10-CM

## 2020-09-07 DIAGNOSIS — Z0000 Encounter for general adult medical examination without abnormal findings: Secondary | ICD-10-CM | POA: Diagnosis not present

## 2020-09-07 LAB — POCT URINALYSIS DIPSTICK
Bilirubin, UA: NEGATIVE
Glucose, UA: NEGATIVE
Ketones, UA: NEGATIVE
Leukocytes, UA: NEGATIVE
Nitrite, UA: NEGATIVE
Protein, UA: NEGATIVE
Spec Grav, UA: <=1.005 — AB (ref 1.010–1.025)
Urobilinogen, UA: 0.2 E.U./dL
pH, UA: 7 (ref 5.0–8.0)

## 2020-09-07 NOTE — Progress Notes (Signed)
Complete physical exam   Patient: Karen Henry   DOB: May 02, 1966   55 y.o. Female  MRN: [id number] Visit Date: 09/07/2020  Today's healthcare provider: Megan Mans, MD   Chief Complaint  Patient presents with  . Annual Exam   Subjective    Karen Henry is a 55 y.o. female who presents today for a complete physical exam.  She reports consuming a general diet. Exercises regularly. She generally feels well. She reports sleeping well. She does not have additional problems to discuss today.  The patient now has bilateral hearing aids.  This is helped. Well woman exam per clinical clinic GYN. She did have COVID over Christmas.  She has recovered completely. Her father is presently in the hospital after a COVID infection and a fall.  He has dementia and has had a recent MI.  This is causing a lot of stress for patient and for her mother. HPI    Past Medical History:  Diagnosis Date  . Anxiety   . GERD (gastroesophageal reflux disease)   . High blood cholesterol level   . Hypercoagulable state (HCC)   . Hypertension   . Rhinitis    Past Surgical History:  Procedure Laterality Date  . COLONOSCOPY WITH PROPOFOL N/A 03/24/2017   Procedure: COLONOSCOPY WITH PROPOFOL;  Surgeon: Scot Jun, MD;  Location: [facility] END[facility]COPY;  Service: Endoscopy;  Laterality: N/A;  . TUBAL LIGATION     Social History   Socioeconomic History  . Marital status: Married    Spouse name: Not on file  . Number of children: Not on file  . Years of education: Not on file  . Highest education level: Not on file  Occupational History  . Not on file  Tobacco Use  . Smoking status: Never Smoker  . Smokeless tobacco: Never Used  Vaping Use  . Vaping Use: Never used  Substance and Sexual Activity  . Alcohol use: Yes    Comment: social  . Drug use: No  . Sexual activity: Not on file  Other Topics Concern  . Not on file  Social History Narrative  . Not on file   Social Determinants  of Health   Financial Resource Strain: Not on file  Food Insecurity: Not on file  Transportation Needs: Not on file  Physical Activity: Not on file  Stress: Not on file  Social Connections: Not on file  Intimate Partner Violence: Not on file   Family Status  Relation Name Status  . Mother  Alive  . Father  Alive  . Sister  Alive  . Brother  Alive  . Son  Alive  . Neg Hx  (Not Specified)   Family History  Problem Relation Age of Onset  . Hypertension Mother   . Hypertension Father   . Heart disease Father   . Healthy Sister   . Diabetes Brother   . ADD / ADHD Son   . Breast cancer Neg Hx   . Colon cancer Neg Hx   . Ovarian cancer Neg Hx    Allergies  Allergen Reactions  . Eggs Or Egg-Derived Products Itching  . Penicillins Rash    Patient Care Team: Maple Hudson., MD as PCP - General (Family Medicine)   Medications: Outpatient Medications Prior to Visit  Medication Sig  . aspirin 81 MG tablet Take by mouth.  Marland Kitchen Bioflavonoid Products (VITAMIN C) CHEW Chew by mouth.  . bisoprolol-hydrochlorothiazide (ZIAC) 5-6.25 MG tablet Take 1 tablet  by mouth daily.  . bisoprolol-hydrochlorothiazide (ZIAC) 5-6.25 MG tablet TAKE 1 TABLET BY MOUTH DAILY.  . bisoprolol-hydrochlorothiazide (ZIAC) 5-6.25 MG tablet TAKE 1 TABLET BY MOUTH EVERY DAY  . Cetirizine HCl (ZYRTEC ALLERGY) 10 MG CAPS Take by mouth.  Marland Kitchen COENZYME Q-10 PO Take by mouth.  . naproxen (EC NAPROSYN) 500 MG EC tablet TAKE 1 TABLET BY MOUTH 2 TIMES DAILY WITH A MEAL.  Marland Kitchen omeprazole (PRILOSEC) 20 MG capsule TAKE 1 CAPSULE BY MOUTH EVERY DAY  . rosuvastatin (CRESTOR) 10 MG tablet TAKE 1 TABLET BY MOUTH EVERY DAY  . triamcinolone cream (KENALOG) 0.1 % Apply 1 application topically 2 (two) times daily. Apply 1 application 2 times daily to facial patches of dermatitis   No facility-administered medications prior to visit.    Review of Systems    Objective    BP 130/82 (BP Location: Left Arm, Patient Position:  Sitting, Cuff Size: Large)   Pulse 68   Temp 98.7 F (37.1 C) (Oral)   Ht 5\' 4"  (1.626 m)   Wt 158 lb (71.7 kg)   SpO2 100%   BMI 27.12 kg/m    Physical Exam Vitals reviewed.  Constitutional:      Appearance: She is well-developed.  HENT:     Head: Normocephalic and atraumatic.     Right Ear: External ear normal.     Left Ear: External ear normal.  Eyes:     Conjunctiva/sclera: Conjunctivae normal.     Pupils: Pupils are equal, round, and reactive to light.  Cardiovascular:     Rate and Rhythm: Normal rate and regular rhythm.     Heart sounds: Normal heart sounds. No murmur heard. No gallop.   Pulmonary:     Effort: Pulmonary effort is normal. No respiratory distress.     Breath sounds: Normal breath sounds. No wheezing.  Abdominal:     General: There is no distension.     Palpations: Abdomen is soft.     Tenderness: There is no abdominal tenderness.  Musculoskeletal:        General: No tenderness.     Cervical back: Normal range of motion and neck supple.  Skin:    Findings: No erythema or rash.  Neurological:     Mental Status: She is alert and oriented to person, place, and time.     Cranial Nerves: No cranial nerve deficit.     Coordination: Coordination normal.  Psychiatric:        Mood and Affect: Mood normal.        Behavior: Behavior normal.        Thought Content: Thought content normal.        Judgment: Judgment normal.       Last depression screening scores PHQ 2/9 Scores 02/25/2019 01/04/2019 01/15/2018  PHQ - 2 Score 0 0 0  PHQ- 9 Score - - -   Last fall risk screening Fall Risk  01/04/2019  Falls in the past year? 0  Number falls in past yr: -  Injury with Fall? -   Last Audit-C alcohol use screening No flowsheet data found. A score of 3 or more in women, and 4 or more in men indicates increased risk for alcohol abuse, EXCEPT if all of the points are from question 1   No results found for any visits on 09/07/20.  Assessment & Plan     Routine Health Maintenance and Physical Exam  Exercise Activities and Dietary recommendations Goals   None     Immunization  History  Administered Date(s) Administered  . Influenza, Quadrivalent, Recombinant, Inj, Pf 07/14/2018  . Influenza,inj,Quad PF,6+ Mos 06/30/2015  . Influenza-Unspecified 06/12/2020  . PFIZER SARS-COV-2 Vaccination 12/14/2019, 01/18/2020  . Tdap 06/30/2014    Health Maintenance  Topic Date Due  . Hepatitis C Screening  Never done  . HIV Screening  Never done  . PAP SMEAR-Modifier  08/25/2018  . COVID-19 Vaccine (3 - Booster for Pfizer series) 07/20/2020  . MAMMOGRAM  12/02/2021  . TETANUS/TDAP  06/30/2024  . COLONOSCOPY (Pts 45-4yrs Insurance coverage will need to be confirmed)  03/25/2027  . INFLUENZA VACCINE  Completed    Discussed health benefits of physical activity, and encouraged her to engage in regular exercise appropriate for her age and condition.  1. Annual physical exam  - CBC with Differential/Platelet - Comprehensive metabolic panel - TSH - Lipid panel - POCT urinalysis dipstick  2. Hematuria, unspecified type Negative microscopic exam.  Recheck urine next visit.  For CNS. - CULTURE, URINE COMPREHENSIVE - Urinalysis, microscopic only   No follow-ups on file.     I, Megan Mans, MD, have reviewed all documentation for this visit. The documentation on 09/12/20 for the exam, diagnosis, procedures, and orders are all accurate and complete.    Paytin Ramakrishnan Wendelyn Breslow, MD  [facility] [phone] (phone) [phone] (fax)  [facility]

## 2020-09-08 LAB — URINALYSIS, MICROSCOPIC ONLY
Bacteria, UA: NONE SEEN
Casts: NONE SEEN /lpf
RBC, Urine: NONE SEEN /hpf (ref 0–2)
WBC, UA: NONE SEEN /hpf (ref 0–5)

## 2020-09-10 ENCOUNTER — Telehealth: Payer: Self-pay

## 2020-09-10 LAB — CULTURE, URINE COMPREHENSIVE

## 2020-09-10 NOTE — Telephone Encounter (Signed)
Patient states she is aware of negative results.

## 2020-09-10 NOTE — Telephone Encounter (Signed)
-----   Message from Maple Hudson., MD sent at 09/10/2020  8:18 AM EST ----- Urine okay.

## 2020-09-10 NOTE — Telephone Encounter (Signed)
LMTCB  09/10/2020.  [facility] please advise pt of lab results.   Thanks,   -Vernona Rieger

## 2020-09-25 ENCOUNTER — Other Ambulatory Visit: Payer: Self-pay | Admitting: Family Medicine

## 2020-11-16 ENCOUNTER — Other Ambulatory Visit: Payer: Self-pay | Admitting: Family Medicine

## 2020-11-16 DIAGNOSIS — L309 Dermatitis, unspecified: Secondary | ICD-10-CM

## 2020-11-16 NOTE — Telephone Encounter (Signed)
Notes to clinic:  this script has expired  Review for continue use   Requested Prescriptions  Pending Prescriptions Disp Refills   triamcinolone (KENALOG) 0.1 % [Pharmacy Med Name: TRIAMCINOLONE 0.1% CREAM] 30 g     Sig: APPLY 1 APPLICATION TOPICALLY 2 (TWO) TIMES DAILY. APPLY 1 APPLICATION 2 TIMES DAILY TO FACIAL PATCHES OF DERMATITIS      Dermatology:  Corticosteroids Passed - 11/16/2020  8:00 AM      Passed - Valid encounter within last 12 months    Recent Outpatient Visits           2 months ago Annual physical exam   [facility] Maple Hudson., MD   8 months ago Hypercholesteremia   [facility] Maple Hudson., MD   1 year ago Dermatitis   [facility] Maple Hudson., MD   2 years ago Allergic rhinitis due to pollen, unspecified seasonality   [facility] Maple Hudson., MD   3 years ago Annual physical exam   [facility] Maple Hudson., MD       Future Appointments             In 3 months Maple Hudson., MD [facility], [facility]              Signed Prescriptions Disp Refills   bisoprolol-hydrochlorothiazide (ZIAC) 5-6.25 MG tablet 90 tablet 0    Sig: TAKE 1 TABLET BY MOUTH EVERY DAY      Cardiovascular: Beta Blocker + Diuretic Combos Failed - 11/16/2020  8:00 AM      Failed - K in normal range and within 180 days    Potassium  Date Value Ref Range Status  02/25/2020 4.1 3.5 - 5.2 mmol/L Final          Failed - Na in normal range and within 180 days    Sodium  Date Value Ref Range Status  02/25/2020 142 134 - 144 mmol/L Final          Failed - Cr in normal range and within 180 days    Creatinine, Ser  Date Value Ref Range Status  02/25/2020 0.84 0.57 - 1.00 mg/dL Final          Failed - Ca in normal range and within 180 days    Calcium  Date Value Ref Range Status  02/25/2020 9.9 8.7 - 10.2 mg/dL Final           Passed - Patient is not pregnant      Passed - Last BP in normal range    BP Readings from Last 1 Encounters:  09/07/20 130/82          Passed - Last Heart Rate in normal range    Pulse Readings from Last 1 Encounters:  09/07/20 68          Passed - Valid encounter within last 6 months    Recent Outpatient Visits           2 months ago Annual physical exam   [facility] Maple Hudson., MD   8 months ago Hypercholesteremia   [facility] Maple Hudson., MD   1 year ago Dermatitis   [facility] Maple Hudson., MD   2 years ago Allergic rhinitis due to pollen, unspecified seasonality   [facility] Maple Hudson., MD   3 years  ago Annual physical exam   [facility] Maple Hudson., MD       Future Appointments             In 3 months Maple Hudson., MD [facility], [facility]

## 2020-11-18 ENCOUNTER — Other Ambulatory Visit: Payer: Self-pay | Admitting: Obstetrics and Gynecology

## 2020-11-18 DIAGNOSIS — Z1231 Encounter for screening mammogram for malignant neoplasm of breast: Secondary | ICD-10-CM | POA: Diagnosis not present

## 2020-11-18 DIAGNOSIS — Z01419 Encounter for gynecological examination (general) (routine) without abnormal findings: Secondary | ICD-10-CM | POA: Diagnosis not present

## 2020-11-18 DIAGNOSIS — Z1331 Encounter for screening for depression: Secondary | ICD-10-CM | POA: Diagnosis not present

## 2020-11-18 DIAGNOSIS — Z1211 Encounter for screening for malignant neoplasm of colon: Secondary | ICD-10-CM | POA: Diagnosis not present

## 2020-12-08 ENCOUNTER — Ambulatory Visit
Admission: RE | Admit: 2020-12-08 | Discharge: 2020-12-08 | Disposition: A | Discharge status: Home / Self Care | Payer: BC Managed Care – PPO | Source: Ambulatory Visit | Attending: Obstetrics and Gynecology | Admitting: Obstetrics and Gynecology

## 2020-12-08 ENCOUNTER — Other Ambulatory Visit: Payer: Self-pay

## 2020-12-08 DIAGNOSIS — Z1231 Encounter for screening mammogram for malignant neoplasm of breast: Secondary | ICD-10-CM | POA: Insufficient documentation

## 2021-02-27 ENCOUNTER — Other Ambulatory Visit: Payer: Self-pay | Admitting: Family Medicine

## 2021-02-27 DIAGNOSIS — M722 Plantar fascial fibromatosis: Secondary | ICD-10-CM

## 2021-02-27 NOTE — Telephone Encounter (Signed)
Requested medication (s) are due for refill today: yes  Requested medication (s) are on the active medication list: yes  Last refill:  02/25/20 #60 3 RF  Future visit scheduled: yes  Notes to clinic:  Overdue Cr and Hgb   Requested Prescriptions  Pending Prescriptions Disp Refills   EC-NAPROXEN 500 MG EC tablet [Pharmacy Med Name: EC-NAPROXEN DR 500 MG TABLET] 60 tablet 3    Sig: TAKE 1 TABLET BY MOUTH 2 TIMES DAILY WITH A MEAL.      Analgesics:  NSAIDS Failed - 02/27/2021  9:02 AM      Failed - Cr in normal range and within 360 days    Creatinine, Ser  Date Value Ref Range Status  02/25/2020 0.84 0.57 - 1.00 mg/dL Final          Failed - HGB in normal range and within 360 days    Hemoglobin  Date Value Ref Range Status  02/25/2020 12.0 11.1 - 15.9 g/dL Final          Passed - Patient is not pregnant      Passed - Valid encounter within last 12 months    Recent Outpatient Visits           5 months ago Annual physical exam   [facility] Maple Hudson., MD   1 year ago Hypercholesteremia   [facility] Maple Hudson., MD   2 years ago Dermatitis   [facility] Maple Hudson., MD   3 years ago Allergic rhinitis due to pollen, unspecified seasonality   [facility] Maple Hudson., MD   4 years ago Annual physical exam   [facility] Maple Hudson., MD       Future Appointments             In 1 week Maple Hudson., MD [facility], [facility]

## 2021-02-28 ENCOUNTER — Other Ambulatory Visit: Payer: Self-pay | Admitting: Family Medicine

## 2021-02-28 NOTE — Telephone Encounter (Signed)
Requested medication (s) are due for refill today: yes  Requested medication (s) are on the active medication list: yes  Last refill:  11/16/20 #90  Future visit scheduled: yes  03/08/21  Notes to clinic:  overdue lab work    Requested Prescriptions  Pending Prescriptions Disp Refills   bisoprolol-hydrochlorothiazide (ZIAC) 5-6.25 MG tablet [Pharmacy Med Name: BISOPROLOL-HCTZ 5-6.25 MG TAB] 90 tablet 0    Sig: TAKE 1 TABLET BY MOUTH EVERY DAY      Cardiovascular: Beta Blocker + Diuretic Combos Failed - 02/28/2021  4:27 PM      Failed - K in normal range and within 180 days    Potassium  Date Value Ref Range Status  02/25/2020 4.1 3.5 - 5.2 mmol/L Final          Failed - Na in normal range and within 180 days    Sodium  Date Value Ref Range Status  02/25/2020 142 134 - 144 mmol/L Final          Failed - Cr in normal range and within 180 days    Creatinine, Ser  Date Value Ref Range Status  02/25/2020 0.84 0.57 - 1.00 mg/dL Final          Failed - Ca in normal range and within 180 days    Calcium  Date Value Ref Range Status  02/25/2020 9.9 8.7 - 10.2 mg/dL Final          Passed - Patient is not pregnant      Passed - Last BP in normal range    BP Readings from Last 1 Encounters:  09/07/20 130/82          Passed - Last Heart Rate in normal range    Pulse Readings from Last 1 Encounters:  09/07/20 68          Passed - Valid encounter within last 6 months    Recent Outpatient Visits           5 months ago Annual physical exam   [facility] Maple Hudson., MD   1 year ago Hypercholesteremia   [facility] Maple Hudson., MD   2 years ago Dermatitis   [facility] Maple Hudson., MD   3 years ago Allergic rhinitis due to pollen, unspecified seasonality   [facility] Maple Hudson., MD   4 years ago Annual physical exam   [facility] Maple Hudson., MD       Future Appointments             In 1 week Maple Hudson., MD [facility], [facility]

## 2021-03-08 ENCOUNTER — Encounter: Payer: Self-pay | Admitting: Family Medicine

## 2021-03-08 ENCOUNTER — Ambulatory Visit: Payer: BC Managed Care – PPO | Admitting: Family Medicine

## 2021-03-08 ENCOUNTER — Other Ambulatory Visit: Payer: Self-pay

## 2021-03-08 VITALS — BP 118/75 | HR 66 | Temp 98.3°F | Resp 16 | Ht 64.0 in | Wt 166.0 lb

## 2021-03-08 DIAGNOSIS — I10 Essential (primary) hypertension: Secondary | ICD-10-CM | POA: Diagnosis not present

## 2021-03-08 DIAGNOSIS — F4329 Adjustment disorder with other symptoms: Secondary | ICD-10-CM | POA: Diagnosis not present

## 2021-03-08 DIAGNOSIS — E7800 Pure hypercholesterolemia, unspecified: Secondary | ICD-10-CM

## 2021-03-08 NOTE — Progress Notes (Signed)
I,April Miller,acting as a scribe for Megan Mans, MD.,have documented all relevant documentation on the behalf of Megan Mans, MD,as directed by  Megan Mans, MD while in the presence of Megan Mans, MD.   Established patient visit   Patient: Karen Henry   DOB: Apr 25, 1966   55 y.o. Female  MRN: [id number] Visit Date: 03/08/2021  Today's healthcare provider: Megan Mans, MD   Chief Complaint  Patient presents with   Follow-up   Hypertension   Hyperlipidemia   Subjective    HPI  Patient comes in today for follow-up.  Has had a lot of increased stress as her father is in the nursing home with dementia at [facility] and her mother is an slowly failing health at home. She is ready to get back to her daily walking in the morning. Hypertension, follow-up  BP Readings from Last 3 Encounters:  03/08/21 118/75  09/07/20 130/82  02/25/20 109/74   Wt Readings from Last 3 Encounters:  03/08/21 166 lb (75.3 kg)  09/07/20 158 lb (71.7 kg)  02/25/20 162 lb 3.2 oz (73.6 kg)     She was last seen for hypertension 6 months ago.  BP at that visit was 130/82. Management since that visit includes no medication changes. Continue Ziac.   She reports good compliance with treatment. She is not having side effects. none She is following a Regular diet. She is not exercising. She does not smoke.  Use of agents associated with hypertension: none.   Outside blood pressures are not checking.  Pertinent labs: Lab Results  Component Value Date   CHOL 201 (H) 02/25/2020   HDL 45 02/25/2020   LDLCALC 137 (H) 02/25/2020   TRIG 107 02/25/2020   CHOLHDL 4.5 (H) 02/25/2020   Lab Results  Component Value Date   NA 142 02/25/2020   K 4.1 02/25/2020   CREATININE 0.84 02/25/2020   GFRNONAA 80 02/25/2020   GFRAA 92 02/25/2020   GLUCOSE 95 02/25/2020     The 10-year ASCVD risk score Denman George DC Jr., et al., 2013) is: 2.5%    ----------------------------------------------------------------------------  Lipid/Cholesterol, Follow-up  Last lipid panel Other pertinent labs  Lab Results  Component Value Date   CHOL 201 (H) 02/25/2020   HDL 45 02/25/2020   LDLCALC 137 (H) 02/25/2020   TRIG 107 02/25/2020   CHOLHDL 4.5 (H) 02/25/2020   Lab Results  Component Value Date   ALT 26 02/25/2020   AST 18 02/25/2020   PLT 325 02/25/2020   TSH 1.830 02/25/2020     She was last seen for this 6 months ago.  Management since that visit includes no medication changes. Continue Crestor 10mg .   She reports good compliance with treatment. She is not having side effects. none  Current diet: well balanced Current exercise: none  The 10-year ASCVD risk score DC Jr., et al., 2013) is: 2.5%  ----------------------------------------------------------------------------       Medications: Outpatient Medications Prior to Visit  Medication Sig   aspirin 81 MG tablet Take by mouth.   Bioflavonoid Products (VITAMIN C) CHEW Chew by mouth.   bisoprolol-hydrochlorothiazide (ZIAC) 5-6.25 MG tablet Take 1 tablet by mouth daily.   Cetirizine HCl 10 MG CAPS Take by mouth.   COENZYME Q-10 PO Take by mouth.   EC-NAPROXEN 500 MG EC tablet TAKE 1 TABLET BY MOUTH 2 TIMES DAILY WITH A MEAL.   omeprazole (PRILOSEC) 20 MG capsule TAKE 1 CAPSULE BY MOUTH EVERY  DAY   rosuvastatin (CRESTOR) 10 MG tablet TAKE 1 TABLET BY MOUTH EVERY DAY   triamcinolone (KENALOG) 0.1 % APPLY 1 APPLICATION TOPICALLY 2 (TWO) TIMES DAILY. APPLY 1 APPLICATION 2 TIMES DAILY TO FACIAL PATCHES OF DERMATITIS   [DISCONTINUED] bisoprolol-hydrochlorothiazide (ZIAC) 5-6.25 MG tablet TAKE 1 TABLET BY MOUTH DAILY. (Patient not taking: Reported on 03/08/2021)   [DISCONTINUED] bisoprolol-hydrochlorothiazide (ZIAC) 5-6.25 MG tablet TAKE 1 TABLET BY MOUTH EVERY DAY (Patient not taking: Reported on 03/08/2021)   No facility-administered medications prior to visit.     Review of Systems  Constitutional:  Negative for activity change and fatigue.  Respiratory:  Negative for cough and shortness of breath.   Cardiovascular:  Negative for chest pain, palpitations and leg swelling.  Musculoskeletal:  Negative for arthralgias and myalgias.  Neurological:  Negative for dizziness, light-headedness and headaches.       Objective    BP 118/75 (BP Location: Right Arm, Patient Position: Sitting, Cuff Size: Normal)   Pulse 66   Temp 98.3 F (36.8 C) (Oral)   Resp 16   Ht 5\' 4"  (1.626 m)   Wt 166 lb (75.3 kg)   SpO2 98%   BMI 28.49 kg/m  BP Readings from Last 3 Encounters:  03/08/21 118/75  09/07/20 130/82  02/25/20 109/74   Wt Readings from Last 3 Encounters:  03/08/21 166 lb (75.3 kg)  09/07/20 158 lb (71.7 kg)  02/25/20 162 lb 3.2 oz (73.6 kg)       Physical Exam Vitals reviewed.  Constitutional:      Appearance: She is well-developed.  HENT:     Head: Normocephalic and atraumatic.     Right Ear: External ear normal.     Left Ear: External ear normal.     Nose: Nose normal.  Eyes:     General: No scleral icterus.    Conjunctiva/sclera: Conjunctivae normal.     Pupils: Pupils are equal, round, and reactive to light.  Neck:     Thyroid: No thyromegaly.  Cardiovascular:     Rate and Rhythm: Normal rate and regular rhythm.     Heart sounds: Normal heart sounds.  Pulmonary:     Effort: Pulmonary effort is normal.     Breath sounds: Normal breath sounds.  Abdominal:     Palpations: Abdomen is soft.  Skin:    General: Skin is warm and dry.  Neurological:     General: No focal deficit present.     Mental Status: She is alert and oriented to person, place, and time.  Psychiatric:        Mood and Affect: Mood normal.        Behavior: Behavior normal.        Thought Content: Thought content normal.        Judgment: Judgment normal.      No results found for any visits on 03/08/21.  Assessment & Plan     1. Essential (primary)  hypertension Well-controlled on Ziac - CBC with Differential/Platelet - Comprehensive metabolic panel  2. Hypercholesteremia On Crestor 10 - Lipid panel - TSH  3. Adjustment disorder with other symptom Parents in failing health.  Patient to work on regular walking to help deal with the stress.  Follow-up in 6 months.   No follow-ups on file.      I, 05/09/21, MD, have reviewed all documentation for this visit. The documentation on 03/08/21 for the exam, diagnosis, procedures, and orders are all accurate and complete.    Pleasant Britz  Cranford Mon, MD  [facility] [phone] (phone) [phone] (fax)  [place]

## 2021-03-09 LAB — CBC WITH DIFFERENTIAL/PLATELET
Basophils Absolute: 0.1 10*3/uL (ref 0.0–0.2)
Basos: 2 %
EOS (ABSOLUTE): 0.4 10*3/uL (ref 0.0–0.4)
Eos: 5 %
Hematocrit: 37 % (ref 34.0–46.6)
Hemoglobin: 11.8 g/dL (ref 11.1–15.9)
Immature Grans (Abs): 0 10*3/uL (ref 0.0–0.1)
Immature Granulocytes: 0 %
Lymphocytes Absolute: 1.9 10*3/uL (ref 0.7–3.1)
Lymphs: 24 %
MCH: 26.6 pg (ref 26.6–33.0)
MCHC: 31.9 g/dL (ref 31.5–35.7)
MCV: 84 fL (ref 79–97)
Monocytes Absolute: 0.6 10*3/uL (ref 0.1–0.9)
Monocytes: 7 %
Neutrophils Absolute: 5 10*3/uL (ref 1.4–7.0)
Neutrophils: 62 %
Platelets: 293 10*3/uL (ref 150–450)
RBC: 4.43 x10E6/uL (ref 3.77–5.28)
RDW: 13.6 % (ref 11.7–15.4)
WBC: 8 10*3/uL (ref 3.4–10.8)

## 2021-03-09 LAB — COMPREHENSIVE METABOLIC PANEL
ALT: 23 IU/L (ref 0–32)
AST: 20 IU/L (ref 0–40)
Albumin/Globulin Ratio: 1.7 (ref 1.2–2.2)
Albumin: 4.8 g/dL (ref 3.8–4.9)
Alkaline Phosphatase: 119 IU/L (ref 44–121)
BUN/Creatinine Ratio: 14 (ref 9–23)
BUN: 10 mg/dL (ref 6–24)
Bilirubin Total: 0.3 mg/dL (ref 0.0–1.2)
CO2: 21 mmol/L (ref 20–29)
Calcium: 10.1 mg/dL (ref 8.7–10.2)
Chloride: 100 mmol/L (ref 96–106)
Creatinine, Ser: 0.7 mg/dL (ref 0.57–1.00)
Globulin, Total: 2.9 g/dL (ref 1.5–4.5)
Glucose: 109 mg/dL — ABNORMAL HIGH (ref 65–99)
Potassium: 4.4 mmol/L (ref 3.5–5.2)
Sodium: 138 mmol/L (ref 134–144)
Total Protein: 7.7 g/dL (ref 6.0–8.5)
eGFR: 103 mL/min/{1.73_m2} (ref >59)

## 2021-03-09 LAB — LIPID PANEL
Chol/HDL Ratio: 4.5 ratio — ABNORMAL HIGH (ref 0.0–4.4)
Cholesterol, Total: 181 mg/dL (ref 100–199)
HDL: 40 mg/dL (ref >39)
LDL Chol Calc (NIH): 108 mg/dL — ABNORMAL HIGH (ref 0–99)
Triglycerides: 190 mg/dL — ABNORMAL HIGH (ref 0–149)
VLDL Cholesterol Cal: 33 mg/dL (ref 5–40)

## 2021-03-09 LAB — TSH: TSH: 1.3 u[IU]/mL (ref 0.450–4.500)

## 2021-03-26 ENCOUNTER — Other Ambulatory Visit: Payer: Self-pay | Admitting: Family Medicine

## 2021-03-26 DIAGNOSIS — I10 Essential (primary) hypertension: Secondary | ICD-10-CM

## 2021-03-26 NOTE — Telephone Encounter (Signed)
Requested medication (s) are due for refill today:   No   Requested medication (s) are on the active medication list:   Not as active med  Future visit scheduled:   Yes   Last ordered: 01/10/2017 #90, 3 refills  (4 yrs ago)  Returned for provider to review.   Rx is expired    Requested Prescriptions  Pending Prescriptions Disp Refills   bisoprolol-hydrochlorothiazide (ZIAC) 5-6.25 MG tablet [Pharmacy Med Name: BISOPROLOL-HCTZ 5-6.25 MG TAB] 30 tablet     Sig: TAKE 1 TABLET BY MOUTH EVERY DAY      Cardiovascular: Beta Blocker + Diuretic Combos Passed - 03/26/2021 12:33 PM      Passed - K in normal range and within 180 days    Potassium  Date Value Ref Range Status  03/08/2021 4.4 3.5 - 5.2 mmol/L Final          Passed - Na in normal range and within 180 days    Sodium  Date Value Ref Range Status  03/08/2021 138 134 - 144 mmol/L Final          Passed - Cr in normal range and within 180 days    Creatinine, Ser  Date Value Ref Range Status  03/08/2021 0.70 0.57 - 1.00 mg/dL Final          Passed - Ca in normal range and within 180 days    Calcium  Date Value Ref Range Status  03/08/2021 10.1 8.7 - 10.2 mg/dL Final          Passed - Patient is not pregnant      Passed - Last BP in normal range    BP Readings from Last 1 Encounters:  03/08/21 118/75          Passed - Last Heart Rate in normal range    Pulse Readings from Last 1 Encounters:  03/08/21 66          Passed - Valid encounter within last 6 months    Recent Outpatient Visits           2 weeks ago Essential (primary) hypertension   [facility] Maple Hudson., MD   6 months ago Annual physical exam   [facility] [place] Maple Hudson., MD   1 year ago Hypercholesteremia   [facility] Maple Hudson., MD   2 years ago Dermatitis   [facility] Maple Hudson., MD   3 years ago Allergic rhinitis due to pollen,  unspecified seasonality   [facility] Maple Hudson., MD       Future Appointments             In 6 months Maple Hudson., MD [facility]

## 2021-05-17 ENCOUNTER — Other Ambulatory Visit: Payer: Self-pay | Admitting: Family Medicine

## 2021-05-17 DIAGNOSIS — K219 Gastro-esophageal reflux disease without esophagitis: Secondary | ICD-10-CM

## 2021-08-01 ENCOUNTER — Other Ambulatory Visit: Payer: Self-pay | Admitting: Family Medicine

## 2021-08-01 DIAGNOSIS — E7800 Pure hypercholesterolemia, unspecified: Secondary | ICD-10-CM

## 2021-08-01 NOTE — Telephone Encounter (Signed)
Requested Prescriptions  Pending Prescriptions Disp Refills  . rosuvastatin (CRESTOR) 10 MG tablet [Pharmacy Med Name: ROSUVASTATIN CALCIUM 10 MG TAB] 90 tablet 1    Sig: TAKE 1 TABLET BY MOUTH EVERY DAY     Cardiovascular:  Antilipid - Statins Failed - 08/01/2021  9:14 AM      Failed - LDL in normal range and within 360 days    LDL Chol Calc (NIH)  Date Value Ref Range Status  03/08/2021 108 (H) 0 - 99 mg/dL Final         Failed - Triglycerides in normal range and within 360 days    Triglycerides  Date Value Ref Range Status  03/08/2021 190 (H) 0 - 149 mg/dL Final         Passed - Total Cholesterol in normal range and within 360 days    Cholesterol, Total  Date Value Ref Range Status  03/08/2021 181 100 - 199 mg/dL Final         Passed - HDL in normal range and within 360 days    HDL  Date Value Ref Range Status  03/08/2021 40 >39 mg/dL Final         Passed - Patient is not pregnant      Passed - Valid encounter within last 12 months    Recent Outpatient Visits          4 months ago Essential (primary) hypertension   [facility] Maple Hudson., MD   10 months ago Annual physical exam   [facility] Maple Hudson., MD   1 year ago Hypercholesteremia   [facility] Maple Hudson., MD   2 years ago Dermatitis   [facility] Maple Hudson., MD   3 years ago Allergic rhinitis due to pollen, unspecified seasonality   [facility] Maple Hudson., MD      Future Appointments            In 2 months Maple Hudson., MD [facility]

## 2021-08-04 DIAGNOSIS — H40013 Open angle with borderline findings, low risk, bilateral: Secondary | ICD-10-CM | POA: Diagnosis not present

## 2021-09-19 ENCOUNTER — Other Ambulatory Visit: Payer: Self-pay | Admitting: Family Medicine

## 2021-09-19 DIAGNOSIS — I10 Essential (primary) hypertension: Secondary | ICD-10-CM

## 2021-09-19 NOTE — Telephone Encounter (Signed)
Requested medication (s) are due for refill today: yes  Requested medication (s) are on the active medication list: yes  Last refill:  03/26/21 #90 1 RF  Future visit scheduled: yes  Notes to clinic:  overdue lab work   Requested Prescriptions  Pending Prescriptions Disp Refills   bisoprolol-hydrochlorothiazide (ZIAC) 5-6.25 MG tablet [Pharmacy Med Name: BISOPROLOL-HCTZ 5-6.25 MG TAB] 90 tablet 1    Sig: TAKE 1 TABLET BY MOUTH EVERY DAY     Cardiovascular: Beta Blocker + Diuretic Combos Failed - 09/19/2021  3:21 PM      Failed - K in normal range and within 180 days    Potassium  Date Value Ref Range Status  03/08/2021 4.4 3.5 - 5.2 mmol/L Final          Failed - Na in normal range and within 180 days    Sodium  Date Value Ref Range Status  03/08/2021 138 134 - 144 mmol/L Final          Failed - Cr in normal range and within 180 days    Creatinine, Ser  Date Value Ref Range Status  03/08/2021 0.70 0.57 - 1.00 mg/dL Final          Failed - Ca in normal range and within 180 days    Calcium  Date Value Ref Range Status  03/08/2021 10.1 8.7 - 10.2 mg/dL Final          Failed - Valid encounter within last 6 months    Recent Outpatient Visits           6 months ago Essential (primary) hypertension   [facility] Jerrol Banana., MD   1 year ago Annual physical exam   [facility] Jerrol Banana., MD   1 year ago [place] Jerrol Banana., MD   2 years ago [place] Jerrol Banana., MD   3 years ago Allergic rhinitis due to pollen, unspecified seasonality   [facility] Jerrol Banana., MD       Future Appointments             In 2 weeks Jerrol Banana., MD [facility] [place] - Patient is not pregnant      Passed - Last BP in normal range    BP Readings from Last 1  Encounters:  03/08/21 118/75          Passed - Last Heart Rate in normal range    Pulse Readings from Last 1 Encounters:  03/08/21 66

## 2021-10-07 ENCOUNTER — Other Ambulatory Visit: Payer: Self-pay

## 2021-10-07 ENCOUNTER — Encounter: Payer: Self-pay | Admitting: Family Medicine

## 2021-10-07 ENCOUNTER — Ambulatory Visit (INDEPENDENT_AMBULATORY_CARE_PROVIDER_SITE_OTHER): Payer: BC Managed Care – PPO | Admitting: Family Medicine

## 2021-10-07 VITALS — BP 119/78 | HR 79 | Temp 98.4°F | Resp 16 | Ht 64.0 in | Wt 167.0 lb

## 2021-10-07 DIAGNOSIS — M25561 Pain in right knee: Secondary | ICD-10-CM

## 2021-10-07 DIAGNOSIS — Z0000 Encounter for general adult medical examination without abnormal findings: Secondary | ICD-10-CM

## 2021-10-07 DIAGNOSIS — K219 Gastro-esophageal reflux disease without esophagitis: Secondary | ICD-10-CM

## 2021-10-07 DIAGNOSIS — J309 Allergic rhinitis, unspecified: Secondary | ICD-10-CM

## 2021-10-07 DIAGNOSIS — I10 Essential (primary) hypertension: Secondary | ICD-10-CM | POA: Diagnosis not present

## 2021-10-07 DIAGNOSIS — E7800 Pure hypercholesterolemia, unspecified: Secondary | ICD-10-CM

## 2021-10-07 NOTE — Progress Notes (Signed)
Complete physical exam  I,April Miller,acting as a scribe for Wilhemena Durie, MD.,have documented all relevant documentation on the behalf of Wilhemena Durie, MD,as directed by  Wilhemena Durie, MD while in the presence of Wilhemena Durie, MD.   Patient: Karen Henry   DOB: 1966-01-13   56 y.o. Female  MRN: [id number] Visit Date: 10/07/2021  Today's healthcare provider: Wilhemena Durie, MD   Chief Complaint  Patient presents with   Annual Exam   Subjective    Karen Henry is a 56 y.o. female who presents today for a complete physical exam.  She reports consuming a general diet. Home exercise routine includes walking some. She generally feels well. She reports sleeping fairly well. She does not have additional problems to discuss today.  Well woman exam per GYN. She does have some right medial knee pain at times. HPI    Past Medical History:  Diagnosis Date   Anxiety    GERD (gastroesophageal reflux disease)    High blood cholesterol level    Hypercoagulable state ([place])    Hypertension    Rhinitis    Past Surgical History:  Procedure Laterality Date   COLONOSCOPY WITH PROPOFOL N/A 03/24/2017   Procedure: COLONOSCOPY WITH PROPOFOL;  Surgeon: Manya Silvas, MD;  Location: [facility] ENDOSCOPY;  Service: Endoscopy;  Laterality: N/A;   TUBAL LIGATION     Social History   Socioeconomic History   Marital status: Married    Spouse name: Not on file   Number of children: Not on file   Years of education: Not on file   Highest education level: Not on file  Occupational History   Not on file  Tobacco Use   Smoking status: Never   Smokeless tobacco: Never  Vaping Use   Vaping Use: Never used  Substance and Sexual Activity   Alcohol use: Yes    Comment: social   Drug use: No   Sexual activity: Not on file  Other Topics Concern   Not on file  Social History Narrative   Not on file   Social Determinants of Health   Financial Resource Strain:  Not on file  Food Insecurity: Not on file  Transportation Needs: Not on file  Physical Activity: Not on file  Stress: Not on file  Social Connections: Not on file  Intimate Partner Violence: Not on file   Family Status  Relation Name Status   Mother  Alive   Father  Alive   Sister  Alive   Brother  Alive   Son  Alive   Neg Hx  (Not Specified)   Family History  Problem Relation Age of Onset   Hypertension Mother    Hypertension Father    Heart disease Father    Healthy Sister    Diabetes Brother    ADD / ADHD Son    Breast cancer Neg Hx    Colon cancer Neg Hx    Ovarian cancer Neg Hx    Allergies  Allergen Reactions   Eggs Or Egg-Derived Products Itching   Penicillins Rash    Patient Care Team: Jerrol Banana., MD as PCP - General (Family Medicine)   Medications: Outpatient Medications Prior to Visit  Medication Sig   aspirin 81 MG tablet Take by mouth.   Bioflavonoid Products (BIOFLEX PO) Take by mouth every other day.   Bioflavonoid Products (VITAMIN C) CHEW Chew by mouth.   bisoprolol-hydrochlorothiazide (ZIAC) 5-6.25 MG tablet TAKE  1 TABLET BY MOUTH EVERY DAY   Cetirizine HCl 10 MG CAPS Take by mouth.   Cholecalciferol (VITAMIN D3) 25 MCG (1000 UT) CAPS Take 1,000 Units by mouth daily.   COENZYME Q-10 PO Take by mouth.   diphenhydrAMINE HCl (BENADRYL ALLERGY PO) Take by mouth at bedtime.   EC-NAPROXEN 500 MG EC tablet TAKE 1 TABLET BY MOUTH 2 TIMES DAILY WITH A MEAL.   omeprazole (PRILOSEC) 20 MG capsule TAKE 1 CAPSULE BY MOUTH EVERY DAY   rosuvastatin (CRESTOR) 10 MG tablet TAKE 1 TABLET BY MOUTH EVERY DAY   triamcinolone (KENALOG) 0.1 % APPLY 1 APPLICATION TOPICALLY 2 (TWO) TIMES DAILY. APPLY 1 APPLICATION 2 TIMES DAILY TO FACIAL PATCHES OF DERMATITIS   No facility-administered medications prior to visit.    Review of Systems  Eyes:  Positive for photophobia.  Musculoskeletal:  Positive for arthralgias.  Allergic/Immunologic: Positive for  environmental allergies and food allergies.  All other systems reviewed and are negative.     Objective    BP 119/78 (BP Location: Right Arm, Patient Position: Sitting, Cuff Size: Normal)    Pulse 79    Temp 98.4 F (36.9 C) (Temporal)    Resp 16    Ht 5\' 4"  (1.626 m)    Wt 167 lb (75.8 kg)    SpO2 98%    BMI 28.67 kg/m  BP Readings from Last 3 Encounters:  10/07/21 119/78  03/08/21 118/75  09/07/20 130/82   Wt Readings from Last 3 Encounters:  10/07/21 167 lb (75.8 kg)  03/08/21 166 lb (75.3 kg)  09/07/20 158 lb (71.7 kg)      Physical Exam Vitals reviewed.  Constitutional:      Appearance: She is well-developed.  HENT:     Head: Normocephalic and atraumatic.     Right Ear: External ear normal.     Left Ear: External ear normal.  Eyes:     Conjunctiva/sclera: Conjunctivae normal.     Pupils: Pupils are equal, round, and reactive to light.  Cardiovascular:     Rate and Rhythm: Normal rate and regular rhythm.     Heart sounds: Normal heart sounds. No murmur heard.   No gallop.  Pulmonary:     Effort: Pulmonary effort is normal. No respiratory distress.     Breath sounds: Normal breath sounds. No wheezing.  Abdominal:     General: There is no distension.     Palpations: Abdomen is soft.     Tenderness: There is no abdominal tenderness.  Musculoskeletal:        General: No tenderness.     Cervical back: Normal range of motion and neck supple.     Comments: Right knee is stable but she has some mild tenderness along the medial joint line.  Skin:    General: Skin is warm and dry.     Findings: No erythema or rash.  Neurological:     General: No focal deficit present.     Mental Status: She is alert and oriented to person, place, and time.     Cranial Nerves: No cranial nerve deficit.     Coordination: Coordination normal.  Psychiatric:        Mood and Affect: Mood normal.        Behavior: Behavior normal.        Thought Content: Thought content normal.         Judgment: Judgment normal.      Last depression screening scores PHQ 2/9 Scores 10/07/2021 03/08/2021 09/07/2020  PHQ - 2 Score 0 0 0  PHQ- 9 Score 1 1 0   Last fall risk screening Fall Risk  10/07/2021  Falls in the past year? 0  Number falls in past yr: 0  Injury with Fall? 0  Risk for fall due to : No Fall Risks  Follow up Falls evaluation completed   Last Audit-C alcohol use screening Alcohol Use Disorder Test (AUDIT) 10/07/2021  1. How often do you have a drink containing alcohol? 2  2. How many drinks containing alcohol do you have on a typical day when you are drinking? 0  3. How often do you have six or more drinks on one occasion? 0  AUDIT-C Score 2  Alcohol Brief Interventions/Follow-up -   A score of 3 or more in women, and 4 or more in men indicates increased risk for alcohol abuse, EXCEPT if all of the points are from question 1   No results found for any visits on 10/07/21.  Assessment & Plan    Routine Health Maintenance and Physical Exam  Exercise Activities and Dietary recommendations  Goals   None     Immunization History  Administered Date(s) Administered   Influenza, Quadrivalent, Recombinant, Inj, Pf 07/14/2018   Influenza,inj,Quad PF,6+ Mos 06/30/2015   Influenza-Unspecified 06/12/2020   PFIZER(Purple Top)SARS-COV-2 Vaccination 12/14/2019, 01/18/2020   Tdap 06/30/2014    Health Maintenance  Topic Date Due   HIV Screening  Never done   Hepatitis C Screening  Never done   Zoster Vaccines- Shingrix (1 of 2) Never done   PAP SMEAR-Modifier  08/25/2018   COVID-19 Vaccine (3 - Booster for Pfizer series) 03/14/2020   INFLUENZA VACCINE  03/29/2021   MAMMOGRAM  12/09/2022   TETANUS/TDAP  06/30/2024   COLONOSCOPY (Pts 45-82yrs Insurance coverage will need to be confirmed)  03/25/2027   HPV VACCINES  Aged Out    Discussed health benefits of physical activity, and encouraged her to engage in regular exercise appropriate for her age and condition.  1.  Annual physical exam  - Lipid panel - TSH - CBC w/Diff/Platelet - Comprehensive Metabolic Panel (CMET)  2. Hypercholesteremia  - Lipid panel - TSH - CBC w/Diff/Platelet - Comprehensive Metabolic Panel (CMET)  3. Essential (primary) hypertension  - Lipid panel - TSH - CBC w/Diff/Platelet - Comprehensive Metabolic Panel (CMET)  4. Gastroesophageal reflux disease, unspecified whether esophagitis present  - Lipid panel - TSH - CBC w/Diff/Platelet - Comprehensive Metabolic Panel (CMET)  5. Allergic rhinitis, unspecified seasonality, unspecified trigger  - Lipid panel - TSH - CBC w/Diff/Platelet - Comprehensive Metabolic Panel (CMET) 6.  Right knee pain Could be a meniscal injury.  At this time we will try naproxen for the rest of this month and BIOflex.  May need orthopedic referral.  Return in about 1 year (around 10/11/2022).     I, Wilhemena Durie, MD, have reviewed all documentation for this visit. The documentation on 10/12/21 for the exam, diagnosis, procedures, and orders are all accurate and complete.    Fatim Vanderschaaf Cranford Mon, MD  [facility] [phone]) [phone] (fax)  [place]

## 2021-10-08 LAB — CBC WITH DIFFERENTIAL/PLATELET
Basophils Absolute: 0.1 10*3/uL (ref 0.0–0.2)
Basos: 2 %
EOS (ABSOLUTE): 0.4 10*3/uL (ref 0.0–0.4)
Eos: 6 %
Hematocrit: 35.3 % (ref 34.0–46.6)
Hemoglobin: 11.9 g/dL (ref 11.1–15.9)
Immature Grans (Abs): 0 10*3/uL (ref 0.0–0.1)
Immature Granulocytes: 0 %
Lymphocytes Absolute: 2.1 10*3/uL (ref 0.7–3.1)
Lymphs: 29 %
MCH: 27.4 pg (ref 26.6–33.0)
MCHC: 33.7 g/dL (ref 31.5–35.7)
MCV: 81 fL (ref 79–97)
Monocytes Absolute: 0.6 10*3/uL (ref 0.1–0.9)
Monocytes: 8 %
Neutrophils Absolute: 4 10*3/uL (ref 1.4–7.0)
Neutrophils: 55 %
Platelets: 308 10*3/uL (ref 150–450)
RBC: 4.34 x10E6/uL (ref 3.77–5.28)
RDW: 13.1 % (ref 11.7–15.4)
WBC: 7.2 10*3/uL (ref 3.4–10.8)

## 2021-10-08 LAB — COMPREHENSIVE METABOLIC PANEL
ALT: 29 IU/L (ref 0–32)
AST: 21 IU/L (ref 0–40)
Albumin/Globulin Ratio: 1.7 (ref 1.2–2.2)
Albumin: 4.8 g/dL (ref 3.8–4.9)
Alkaline Phosphatase: 113 IU/L (ref 44–121)
BUN/Creatinine Ratio: 19 (ref 9–23)
BUN: 15 mg/dL (ref 6–24)
Bilirubin Total: 0.3 mg/dL (ref 0.0–1.2)
CO2: 23 mmol/L (ref 20–29)
Calcium: 9.9 mg/dL (ref 8.7–10.2)
Chloride: 105 mmol/L (ref 96–106)
Creatinine, Ser: 0.79 mg/dL (ref 0.57–1.00)
Globulin, Total: 2.9 g/dL (ref 1.5–4.5)
Glucose: 93 mg/dL (ref 70–99)
Potassium: 4.7 mmol/L (ref 3.5–5.2)
Sodium: 144 mmol/L (ref 134–144)
Total Protein: 7.7 g/dL (ref 6.0–8.5)
eGFR: 88 mL/min/{1.73_m2} (ref >59)

## 2021-10-08 LAB — LIPID PANEL
Chol/HDL Ratio: 4.1 ratio (ref 0.0–4.4)
Cholesterol, Total: 173 mg/dL (ref 100–199)
HDL: 42 mg/dL (ref >39)
LDL Chol Calc (NIH): 102 mg/dL — ABNORMAL HIGH (ref 0–99)
Triglycerides: 163 mg/dL — ABNORMAL HIGH (ref 0–149)
VLDL Cholesterol Cal: 29 mg/dL (ref 5–40)

## 2021-10-08 LAB — TSH: TSH: 1.52 u[IU]/mL (ref 0.450–4.500)

## 2021-10-15 ENCOUNTER — Other Ambulatory Visit: Payer: Self-pay | Admitting: Family Medicine

## 2021-10-15 DIAGNOSIS — M722 Plantar fascial fibromatosis: Secondary | ICD-10-CM

## 2021-10-15 NOTE — Telephone Encounter (Signed)
Requested Prescriptions  Pending Prescriptions Disp Refills   EC-NAPROXEN 500 MG EC tablet [Pharmacy Med Name: EC-NAPROXEN DR 500 MG TABLET] 60 tablet 3    Sig: TAKE 1 TABLET BY MOUTH 2 TIMES DAILY WITH A MEAL.     Analgesics:  NSAIDS Failed - 10/15/2021  1:53 AM      Failed - Manual Review: Labs are only required if the patient has taken medication for more than 8 weeks.      Passed - Cr in normal range and within 360 days    Creatinine, Ser  Date Value Ref Range Status  10/07/2021 0.79 0.57 - 1.00 mg/dL Final         Passed - HGB in normal range and within 360 days    Hemoglobin  Date Value Ref Range Status  10/07/2021 11.9 11.1 - 15.9 g/dL Final         Passed - PLT in normal range and within 360 days    Platelets  Date Value Ref Range Status  10/07/2021 308 150 - 450 x10E3/uL Final         Passed - HCT in normal range and within 360 days    Hematocrit  Date Value Ref Range Status  10/07/2021 35.3 34.0 - 46.6 % Final         Passed - eGFR is 30 or above and within 360 days    GFR calc Af Amer  Date Value Ref Range Status  02/25/2020 92 >59 mL/min/1.73 Final    Comment:    **[facility] currently reports eGFR in compliance with the current**   recommendations of the [employer]. [facility] will   update reporting as new guidelines are published from the NKF-ASN   Task force.    GFR calc non Af Amer  Date Value Ref Range Status  02/25/2020 80 >59 mL/min/1.73 Final   eGFR  Date Value Ref Range Status  10/07/2021 88 >59 mL/min/1.73 Final         Passed - Patient is not pregnant      Passed - Valid encounter within last 12 months    Recent Outpatient Visits          1 week ago Annual physical exam   [facility] Jerrol Banana., [place]   7 months ago Essential (primary) hypertension   [facility] Jerrol Banana., MD   1 year ago Annual physical exam   [facility] Jerrol Banana., MD   1  year ago [place] Jerrol Banana., MD   2 years ago [place] Jerrol Banana., MD      Future Appointments            In 12 months Jerrol Banana., MD [facility], [place]

## 2021-10-26 ENCOUNTER — Telehealth: Payer: Self-pay

## 2021-10-26 NOTE — Telephone Encounter (Signed)
Copied from CRM [id number]. Topic: General - Call Back - No Documentation >> Oct 26, 2021  3:10 PM Randol Kern wrote: Reason for CRM: Pt called reporting that she missed a call from PCP's nurse. Requesting call back  Best contact:  [phone]

## 2021-10-27 NOTE — Telephone Encounter (Signed)
Pt made an additional call, stated she has not heard back yet, please advise.  ?

## 2021-11-13 ENCOUNTER — Other Ambulatory Visit: Payer: Self-pay | Admitting: Family Medicine

## 2021-11-13 DIAGNOSIS — E7800 Pure hypercholesterolemia, unspecified: Secondary | ICD-10-CM

## 2021-11-24 DIAGNOSIS — Z01419 Encounter for gynecological examination (general) (routine) without abnormal findings: Secondary | ICD-10-CM | POA: Diagnosis not present

## 2021-11-24 DIAGNOSIS — Z1231 Encounter for screening mammogram for malignant neoplasm of breast: Secondary | ICD-10-CM | POA: Diagnosis not present

## 2021-11-24 DIAGNOSIS — Z1211 Encounter for screening for malignant neoplasm of colon: Secondary | ICD-10-CM | POA: Diagnosis not present

## 2021-11-29 ENCOUNTER — Other Ambulatory Visit: Payer: Self-pay | Admitting: Obstetrics and Gynecology

## 2021-11-29 DIAGNOSIS — Z1231 Encounter for screening mammogram for malignant neoplasm of breast: Secondary | ICD-10-CM

## 2021-12-02 DIAGNOSIS — Z1211 Encounter for screening for malignant neoplasm of colon: Secondary | ICD-10-CM | POA: Diagnosis not present

## 2021-12-18 ENCOUNTER — Other Ambulatory Visit: Payer: Self-pay | Admitting: Family Medicine

## 2021-12-18 DIAGNOSIS — I10 Essential (primary) hypertension: Secondary | ICD-10-CM

## 2022-01-04 ENCOUNTER — Ambulatory Visit
Admission: RE | Admit: 2022-01-04 | Discharge: 2022-01-04 | Disposition: A | Discharge status: Home / Self Care | Payer: BC Managed Care – PPO | Source: Ambulatory Visit | Attending: Obstetrics and Gynecology | Admitting: Obstetrics and Gynecology

## 2022-01-04 DIAGNOSIS — Z1231 Encounter for screening mammogram for malignant neoplasm of breast: Secondary | ICD-10-CM | POA: Diagnosis not present

## 2022-02-12 ENCOUNTER — Other Ambulatory Visit: Payer: Self-pay | Admitting: Family Medicine

## 2022-02-12 DIAGNOSIS — K219 Gastro-esophageal reflux disease without esophagitis: Secondary | ICD-10-CM

## 2022-02-14 NOTE — Telephone Encounter (Signed)
Requested Prescriptions  Pending Prescriptions Disp Refills  . omeprazole (PRILOSEC) 20 MG capsule [Pharmacy Med Name: OMEPRAZOLE DR 20 MG CAPSULE] 90 capsule 1    Sig: TAKE 1 CAPSULE BY MOUTH EVERY DAY     Gastroenterology: Proton Pump Inhibitors Passed - 02/12/2022  9:06 AM      Passed - Valid encounter within last 12 months    Recent Outpatient Visits          4 months ago Annual physical exam   [facility] Maple Hudson., MD   11 months ago Essential (primary) hypertension   [facility] Maple Hudson., MD   1 year ago Annual physical exam   [facility] Maple Hudson., MD   1 year ago Hypercholesteremia   [facility] Maple Hudson., MD   2 years ago Dermatitis   [facility] Maple Hudson., MD      Future Appointments            In 7 months Maple Hudson., MD [facility]

## 2022-03-10 ENCOUNTER — Other Ambulatory Visit: Payer: Self-pay | Admitting: Family Medicine

## 2022-03-10 DIAGNOSIS — M722 Plantar fascial fibromatosis: Secondary | ICD-10-CM

## 2022-03-11 ENCOUNTER — Telehealth: Payer: Self-pay | Admitting: Family Medicine

## 2022-03-11 DIAGNOSIS — I10 Essential (primary) hypertension: Secondary | ICD-10-CM

## 2022-03-11 DIAGNOSIS — K219 Gastro-esophageal reflux disease without esophagitis: Secondary | ICD-10-CM

## 2022-03-11 DIAGNOSIS — E7800 Pure hypercholesterolemia, unspecified: Secondary | ICD-10-CM

## 2022-03-11 MED ORDER — OMEPRAZOLE 20 MG PO CPDR: DELAYED_RELEASE_CAPSULE | ORAL | 3 refills | Status: DC

## 2022-03-11 MED ORDER — BISOPROLOL-HYDROCHLOROTHIAZIDE 5-6.25 MG PO TABS: 1.0000 | ORAL_TABLET | Freq: Every day | ORAL | 3 refills | Status: DC

## 2022-03-11 MED ORDER — ROSUVASTATIN CALCIUM 10 MG PO TABS: 10.0000 mg | ORAL_TABLET | Freq: Every day | ORAL | 3 refills | Status: DC

## 2022-03-11 NOTE — Telephone Encounter (Signed)
Done

## 2022-03-11 NOTE — Telephone Encounter (Signed)
[facility] faxed refill request for the following medications:  bisoprolol-hydrochlorothiazide (ZIAC) 5-6.25 MG tablet  omeprazole (PRILOSEC) 20 MG capsule   rosuvastatin (CRESTOR) 10 MG tablet   Please advise.

## 2022-10-10 NOTE — Progress Notes (Signed)
I,[name],acting as a scribe for [employer], MD.,have documented all relevant documentation on the behalf of Eulis Foster, MD,as directed by  Eulis Foster, MD while in the presence of Eulis Foster, MD.   Complete physical exam   Patient: Karen Henry   DOB: October 06, 1965   57 y.o. Female  MRN: [id number] Visit Date: 10/11/2022  Today's healthcare provider: Eulis Foster, MD   Chief Complaint  Patient presents with   Annual Exam   Subjective    Karen Henry is a 57 y.o. female who presents today for a complete physical exam.   She reports consuming a general diet. Home exercise routine includes walks 15-30 minutes daily. She generally feels well. She reports sleeping well. She does not have additional problems to discuss today.  HPI  Patient is followed by GYN- reports having an appt next month   HTN  Patient has history of HTN She is tolerating bisoprolol-hctz 5-6.25mg daily  She reports adherence to this regimen    HLD She reports taking crestor 7m daily for cholesterol  GERD  Patient is on omeprazole 295mdaily  She is tolerating this agent  Symptoms are reported well controlled on medication  She reports taking this daily and that if she does not take it, symptoms will reoccur    Allergic Rhinitis   Health Maintenance  Recommended for HIV and Hep C screening  Pap smear is recommended, will have completed with her GYN   Vaccines  Recommended COVID- she declines today   influenza (2023 through work)completed  Shingrix vaccines (agreeable to this today)   Past Medical History:  Diagnosis Date   Anxiety    GERD (gastroesophageal reflux disease)    High blood cholesterol level    Hypercoagulable state (HCReliance   Hypertension    Rhinitis    Past Surgical History:  Procedure Laterality Date   COLONOSCOPY WITH PROPOFOL N/A 03/24/2017   Procedure: COLONOSCOPY WITH PROPOFOL;  Surgeon:  ElManya SilvasMD;  [facility];  Service: Endoscopy;  Laterality: N/A;   TUBAL LIGATION     Social History   Socioeconomic History   Marital status: Married    Spouse name: Not on file   Number of children: Not on file   Years of education: Not on file   Highest education level: Not on file  Occupational History   Not on file  Tobacco Use   Smoking status: Never   Smokeless tobacco: Never  Vaping Use   Vaping Use: Never used  Substance and Sexual Activity   Alcohol use: Yes    Comment: social   Drug use: No   Sexual activity: Not on file  Other Topics Concern   Not on file  Social History Narrative   Not on file   Social Determinants of Health   Financial Resource Strain: Not on file  Food Insecurity: Not on file  Transportation Needs: Not on file  Physical Activity: Not on file  Stress: Not on file  Social Connections: Not on file  Intimate Partner Violence: Not on file   Family Status  Relation Name Status   Mother  Alive   Father  Alive   Sister  Alive   Brother  Alive   Son  Alive   Neg Hx  (Not Specified)   Family History  Problem Relation Age of Onset   Hypertension Mother    Hypertension Father    Heart disease Father    Healthy Sister  Diabetes Brother    ADD / ADHD Son    Breast cancer Neg Hx    Colon cancer Neg Hx    Ovarian cancer Neg Hx    Allergies  Allergen Reactions   Eggs Or Egg-Derived Products Itching   Penicillins Rash    Patient Care Team: Eulis Foster, MD as PCP - General (Family Medicine)   Medications: Outpatient Medications Prior to Visit  Medication Sig   aspirin 81 MG tablet Take by mouth.   Bioflavonoid Products (BIOFLEX PO) Take by mouth every other day.   Bioflavonoid Products (VITAMIN C) CHEW Chew by mouth.   Cetirizine HCl 10 MG CAPS Take by mouth.   Cholecalciferol (VITAMIN D3) 25 MCG (1000 UT) CAPS Take 1,000 Units by mouth daily.   COENZYME Q-10 PO Take by mouth.    diphenhydrAMINE HCl (BENADRYL ALLERGY PO) Take by mouth at bedtime.   triamcinolone (KENALOG) 0.1 % APPLY 1 APPLICATION TOPICALLY 2 (TWO) TIMES DAILY. APPLY 1 APPLICATION 2 TIMES DAILY TO FACIAL PATCHES OF DERMATITIS   [DISCONTINUED] bisoprolol-hydrochlorothiazide (ZIAC) 5-6.25 MG tablet Take 1 tablet by mouth daily.   [DISCONTINUED] naproxen (EC-NAPROXEN) 500 MG EC tablet TAKE 1 TABLET BY MOUTH 2 TIMES DAILY WITH A MEAL.   [DISCONTINUED] omeprazole (PRILOSEC) 20 MG capsule TAKE 1 CAPSULE BY MOUTH EVERY DAY   [DISCONTINUED] rosuvastatin (CRESTOR) 10 MG tablet Take 1 tablet (10 mg total) by mouth daily.   No facility-administered medications prior to visit.    Review of Systems  Allergic/Immunologic: Positive for food allergies.  All other systems reviewed and are negative.     Objective    BP 127/89 (BP Location: Right Arm, Patient Position: Sitting, Cuff Size: Normal)   Pulse 72   Temp 98.2 F (36.8 C) (Oral)   Resp 16   Ht 5' 3"$  (1.6 m)   Wt 169 lb 11.2 oz (77 kg)   BMI 30.06 kg/m     Physical Exam Vitals reviewed.  Constitutional:      General: She is not in acute distress.    Appearance: Normal appearance. She is not ill-appearing, toxic-appearing or diaphoretic.  HENT:     Head: Normocephalic and atraumatic.     Right Ear: Tympanic membrane and external ear normal. There is no impacted cerumen.     Left Ear: Tympanic membrane and external ear normal. There is no impacted cerumen.     Nose: Nose normal.     Mouth/Throat:     Pharynx: Oropharynx is clear.  Eyes:     General: No scleral icterus.    Extraocular Movements: Extraocular movements intact.     Conjunctiva/sclera: Conjunctivae normal.     Pupils: Pupils are equal, round, and reactive to light.  Cardiovascular:     Rate and Rhythm: Normal rate and regular rhythm.     Pulses: Normal pulses.     Heart sounds: Normal heart sounds. No murmur heard.    No friction rub. No gallop.  Pulmonary:     Effort:  Pulmonary effort is normal. No respiratory distress.     Breath sounds: Normal breath sounds. No wheezing, rhonchi or rales.  Abdominal:     General: Bowel sounds are normal. There is no distension.     Palpations: Abdomen is soft. There is no mass.     Tenderness: There is no abdominal tenderness. There is no guarding.  Musculoskeletal:        General: No deformity.     Cervical back: Normal range of motion and neck  supple. No rigidity.     Right lower leg: No edema.     Left lower leg: No edema.  Lymphadenopathy:     Cervical: No cervical adenopathy.  Skin:    General: Skin is warm.     Capillary Refill: Capillary refill takes less than 2 seconds.     Findings: No erythema or rash.  Neurological:     General: No focal deficit present.     Mental Status: She is alert and oriented to person, place, and time.     Motor: No weakness.     Gait: Gait normal.  Psychiatric:        Mood and Affect: Mood normal.        Behavior: Behavior normal.      Last depression screening scores    10/11/2022    9:52 AM 10/07/2021    9:21 AM 03/08/2021    8:55 AM  PHQ 2/9 Scores  PHQ - 2 Score 0 0 0  PHQ- 9 Score  1 1   Last fall risk screening    10/11/2022    9:52 AM  Fall Risk   Falls in the past year? 0  Number falls in past yr: 0  Injury with Fall? 0  Risk for fall due to : No Fall Risks   Last Audit-C alcohol use screening    10/11/2022    9:52 AM  Alcohol Use Disorder Test (AUDIT)  1. How often do you have a drink containing alcohol? 1  2. How many drinks containing alcohol do you have on a typical day when you are drinking? 0  3. How often do you have six or more drinks on one occasion? 0  AUDIT-C Score 1   A score of 3 or more in women, and 4 or more in men indicates increased risk for alcohol abuse, EXCEPT if all of the points are from question 1   No results found for any visits on 10/11/22.  Assessment & Plan    Routine Health Maintenance and Physical Exam  Patient  is here for annual physical exam, all chronic conditions are stable   Exercise Activities and Dietary recommendations  Goals      Activity and Exercise Increased     Karen Henry will continue to walk for 15-30 minute sessions throughout the week to help with weight management      DIET - EAT MORE FRUITS AND VEGETABLES     Karen Henry was work to increase vegetable options aiming for varied colored vegetables with each meal rather than restricting foods         Immunization History  Administered Date(s) Administered   Influenza, Quadrivalent, Recombinant, Inj, Pf 07/14/2018   Influenza,inj,Quad PF,6+ Mos 06/30/2015   Influenza-Unspecified 06/12/2020   PFIZER(Purple Top)SARS-COV-2 Vaccination 12/14/2019, 01/18/2020   Tdap 06/30/2014   Zoster Recombinat (Shingrix) 10/11/2022    Health Maintenance  Topic Date Due   Hepatitis C Screening  Never done   PAP SMEAR-Modifier  08/25/2018   COVID-19 Vaccine (3 - 2023-24 season) 04/29/2022   INFLUENZA VACCINE  11/27/2022 (Originally 03/29/2022)   Zoster Vaccines- Shingrix (2 of 2) 12/06/2022   MAMMOGRAM  01/05/2024   DTaP/Tdap/Td (2 - Td or Tdap) 06/30/2024   COLONOSCOPY (Pts 45-72yr Insurance coverage will need to be confirmed)  03/25/2027   HIV Screening  Completed   HPV VACCINES  Aged Out     Problem List Items Addressed This Visit       Cardiovascular and  Mediastinum   Essential (primary) hypertension    Controlled BP at goal Continue current medications at current doses No medications changes today  Will collect CMP today  Refills provided for Ziac 5-6.25mg daily       Relevant Orders   Comprehensive metabolic panel     Digestive   Acid reflux    Controlled Continue omeprazole 66m daily  No medications changes today  Refills provided for omeprazole 259m     Gastroesophageal reflux disease     Other   Hypercholesteremia    Chronic  Stable on crestor 1045maily  She will continue this medication as  prescribed  No changes today  Will collect fasting lipid panel        Relevant Orders   Lipid panel   Annual physical exam - Primary    Recommended Shingrix vaccine, completed today  HIV , lipid panel, A1c for diabetes screening, and Hep C screenings collected today  She will see GYN in next few weeks for pap smear  Declines COVID vaccine  Influenza vaccine previously completed  Follow up in 1 year for annual physical  In order to promote wellness overall, recommended increasing vegetable intake rather than focusing on limiting foods that she enjoys  Recommended aiming for 4-5 days of moderate to vigurous physical activity to improve cardiovascular health, she will continue walking sessions as she has been       Relevant Orders   Comprehensive metabolic panel   Hemoglobin A1c   Lipid panel   BMI 30.0-30.9,adult    Chronic  Obesity related to excess calories  We discussed recommendations for regular physical activity aiming for 150 mins per week along with consuming diet with rich vegetables and protein  CMP collected, A1c ordered, lipid panel collected       Relevant Orders   Hemoglobin A1c   Encounter for screening for HIV    HIV screening ordered       Relevant Orders   HIV antibody (with reflex)   Need for shingles vaccine    Shingrix vaccine administered today  Patient was observed after vaccine and tolerated the injection well       Relevant Orders   Zoster Recombinant (Shingrix ) (Completed)   Need for hepatitis C screening test    Hep C screening ordered today       Relevant Orders   Hepatitis C Antibody     Return in about 1 year (around 10/12/2023) for physical .       The entirety of the information documented in the History of Present Illness, Review of Systems and Physical Exam were personally obtained by me. Portions of this information were initially documented by JosLyndel PleasureMA and reviewed by me for thoroughness and accuracy.MakEulis FosterD     MakEulis FosterD  [phone]) [phone])  [place]

## 2022-10-11 ENCOUNTER — Ambulatory Visit (INDEPENDENT_AMBULATORY_CARE_PROVIDER_SITE_OTHER): Payer: BC Managed Care – PPO | Admitting: Family Medicine

## 2022-10-11 ENCOUNTER — Encounter: Payer: Self-pay | Admitting: Family Medicine

## 2022-10-11 ENCOUNTER — Other Ambulatory Visit: Payer: Self-pay | Admitting: Family Medicine

## 2022-10-11 ENCOUNTER — Encounter: Payer: BC Managed Care – PPO | Admitting: Family Medicine

## 2022-10-11 VITALS — BP 127/89 | HR 72 | Temp 98.2°F | Resp 16 | Ht 63.0 in | Wt 169.7 lb

## 2022-10-11 DIAGNOSIS — Z6830 Body mass index (BMI) 30.0-30.9, adult: Secondary | ICD-10-CM

## 2022-10-11 DIAGNOSIS — Z23 Encounter for immunization: Secondary | ICD-10-CM | POA: Diagnosis not present

## 2022-10-11 DIAGNOSIS — Z114 Encounter for screening for human immunodeficiency virus [HIV]: Secondary | ICD-10-CM

## 2022-10-11 DIAGNOSIS — Z124 Encounter for screening for malignant neoplasm of cervix: Secondary | ICD-10-CM

## 2022-10-11 DIAGNOSIS — Z0000 Encounter for general adult medical examination without abnormal findings: Secondary | ICD-10-CM | POA: Diagnosis not present

## 2022-10-11 DIAGNOSIS — M722 Plantar fascial fibromatosis: Secondary | ICD-10-CM

## 2022-10-11 DIAGNOSIS — K219 Gastro-esophageal reflux disease without esophagitis: Secondary | ICD-10-CM

## 2022-10-11 DIAGNOSIS — E7800 Pure hypercholesterolemia, unspecified: Secondary | ICD-10-CM

## 2022-10-11 DIAGNOSIS — I10 Essential (primary) hypertension: Secondary | ICD-10-CM

## 2022-10-11 DIAGNOSIS — Z0001 Encounter for general adult medical examination with abnormal findings: Secondary | ICD-10-CM | POA: Diagnosis not present

## 2022-10-11 DIAGNOSIS — Z1159 Encounter for screening for other viral diseases: Secondary | ICD-10-CM | POA: Diagnosis not present

## 2022-10-11 MED ORDER — BISOPROLOL-HYDROCHLOROTHIAZIDE 5-6.25 MG PO TABS: 1.0000 | ORAL_TABLET | Freq: Every day | ORAL | 3 refills | Status: DC

## 2022-10-11 MED ORDER — OMEPRAZOLE 20 MG PO CPDR: DELAYED_RELEASE_CAPSULE | ORAL | 3 refills | Status: DC

## 2022-10-11 MED ORDER — ROSUVASTATIN CALCIUM 10 MG PO TABS: 10.0000 mg | ORAL_TABLET | Freq: Every day | ORAL | 3 refills | Status: DC

## 2022-10-11 NOTE — Telephone Encounter (Signed)
Medication Refill - Medication: bisoprolol-hydrochlorothiazide (ZIAC) 5-6.25 MG tablet , omeprazole (PRILOSEC) 20 MG capsule ,rosuvastatin (CRESTOR) 10 MG tablet   Pt is requesting medication to be transferred to pharmacy below  Please advise.   Has the patient contacted their pharmacy? No. (Agent: If no, request that the patient contact the pharmacy for the refill. If patient does not wish to contact the pharmacy document the reason why and proceed with request.)   Preferred Pharmacy (with phone number or street name):  [facility] [id number], [place]  [place] [address]  Phone: [phone] Fax: [phone]  Hours: Not open 24 hours   Has the patient been seen for an appointment in the last year OR does the patient have an upcoming appointment? Yes.    Agent: Please be advised that RX refills may take up to 3 business days. We ask that you follow-up with your pharmacy.

## 2022-10-11 NOTE — Assessment & Plan Note (Addendum)
Recommended Shingrix vaccine, completed today  HIV , lipid panel, A1c for diabetes screening, and Hep C screenings collected today  She will see GYN in next few weeks for pap smear  Declines COVID vaccine  Influenza vaccine previously completed  Follow up in 1 year for annual physical  In order to promote wellness overall, recommended increasing vegetable intake rather than focusing on limiting foods that she enjoys  Recommended aiming for 4-5 days of moderate to vigurous physical activity to improve cardiovascular health, she will continue walking sessions as she has been

## 2022-10-11 NOTE — Assessment & Plan Note (Signed)
Chronic  Stable on crestor 21m daily  She will continue this medication as prescribed  No changes today  Will collect fasting lipid panel

## 2022-10-11 NOTE — Patient Instructions (Addendum)
We will follow up with results of labs once they are available.    Health Maintenance, Female Adopting a healthy lifestyle and getting preventive care are important in promoting health and wellness. Ask your health care provider about: The right schedule for you to have regular tests and exams. Things you can do on your own to prevent diseases and keep yourself healthy. What should I know about diet, weight, and exercise? Eat a healthy diet  Eat a diet that includes plenty of vegetables, fruits, low-fat dairy products, and lean protein. Do not eat a lot of foods that are high in solid fats, added sugars, or sodium. Maintain a healthy weight Body mass index (BMI) is used to identify weight problems. It estimates body fat based on height and weight. Your health care provider can help determine your BMI and help you achieve or maintain a healthy weight. Get regular exercise Get regular exercise. This is one of the most important things you can do for your health. Most adults should: Exercise for at least 150 minutes each week. The exercise should increase your heart rate and make you sweat (moderate-intensity exercise). Do strengthening exercises at least twice a week. This is in addition to the moderate-intensity exercise. Spend less time sitting. Even light physical activity can be beneficial. Watch cholesterol and blood lipids Have your blood tested for lipids and cholesterol at 57 years of age, then have this test every 5 years. Have your cholesterol levels checked more often if: Your lipid or cholesterol levels are high. You are older than 57 years of age. You are at high risk for heart disease. What should I know about cancer screening? Depending on your health history and family history, you may need to have cancer screening at various ages. This may include screening for: Breast cancer. Cervical cancer. Colorectal cancer. Skin cancer. Lung cancer. What should I know about heart  disease, diabetes, and high blood pressure? Blood pressure and heart disease High blood pressure causes heart disease and increases the risk of stroke. This is more likely to develop in people who have high blood pressure readings or are overweight. Have your blood pressure checked: Every 3-5 years if you are 57-12 years of age. Every year if you are 1 years old or older. Diabetes Have regular diabetes screenings. This checks your fasting blood sugar level. Have the screening done: Once every three years after age 48 if you are at a normal weight and have a low risk for diabetes. More often and at a younger age if you are overweight or have a high risk for diabetes. What should I know about preventing infection? Hepatitis B If you have a higher risk for hepatitis B, you should be screened for this virus. Talk with your health care provider to find out if you are at risk for hepatitis B infection. Hepatitis C Testing is recommended for: Everyone born from [birth year] through 1965. Anyone with known risk factors for hepatitis C. Sexually transmitted infections (STIs) Get screened for STIs, including gonorrhea and chlamydia, if: You are sexually active and are younger than 57 years of age. You are older than 57 years of age and your health care provider tells you that you are at risk for this type of infection. Your sexual activity has changed since you were last screened, and you are at increased risk for chlamydia or gonorrhea. Ask your health care provider if you are at risk. Ask your health care provider about whether you are at high risk  for HIV. Your health care provider may recommend a prescription medicine to help prevent HIV infection. If you choose to take medicine to prevent HIV, you should first get tested for HIV. You should then be tested every 3 months for as long as you are taking the medicine. Pregnancy If you are about to stop having your period (premenopausal) and you may become  pregnant, seek counseling before you get pregnant. Take 400 to 800 micrograms (mcg) of folic acid every day if you become pregnant. Ask for birth control (contraception) if you want to prevent pregnancy. Osteoporosis and menopause Osteoporosis is a disease in which the bones lose minerals and strength with aging. This can result in bone fractures. If you are 14 years old or older, or if you are at risk for osteoporosis and fractures, ask your health care provider if you should: Be screened for bone loss. Take a calcium or vitamin D supplement to lower your risk of fractures. Be given hormone replacement therapy (HRT) to treat symptoms of menopause. Follow these instructions at home: Alcohol use Do not drink alcohol if: Your health care provider tells you not to drink. You are pregnant, may be pregnant, or are planning to become pregnant. If you drink alcohol: Limit how much you have to: 0-1 drink a day. Know how much alcohol is in your drink. In the [place] one drink equals one 12 oz bottle of beer (355 mL), one 5 oz glass of wine (148 mL), or one 1 oz glass of hard liquor (44 mL). Lifestyle Do not use any products that contain nicotine or tobacco. These products include cigarettes, chewing tobacco, and vaping devices, such as e-cigarettes. If you need help quitting, ask your health care provider. Do not use street drugs. Do not share needles. Ask your health care provider for help if you need support or information about quitting drugs. General instructions Schedule regular health, dental, and eye exams. Stay current with your vaccines. Tell your health care provider if: You often feel depressed. You have ever been abused or do not feel safe at home. Summary Adopting a healthy lifestyle and getting preventive care are important in promoting health and wellness. Follow your health care provider's instructions about healthy diet, exercising, and getting tested or screened for  diseases. Follow your health care provider's instructions on monitoring your cholesterol and blood pressure. This information is not intended to replace advice given to you by your health care provider. Make sure you discuss any questions you have with your health care provider. Document Revised: 01/04/2021 Document Reviewed: 01/04/2021 Elsevier Patient Education  [place].

## 2022-10-11 NOTE — Assessment & Plan Note (Addendum)
Controlled BP at goal Continue current medications at current doses No medications changes today  Will collect CMP today  Refills provided for Ziac 5-6.25mg daily

## 2022-10-11 NOTE — Assessment & Plan Note (Addendum)
Controlled Continue omeprazole 22m daily  No medications changes today  Refills provided for omeprazole 246m

## 2022-10-11 NOTE — Telephone Encounter (Unsigned)
Copied from [place] [id number]. Topic: General - Other >> Oct 11, 2022  1:38 PM Everette C wrote: Reason for CRM: Medication Refill - Medication: naproxen (EC-NAPROXEN) 500 MG EC tablet [id number]  Has the patient contacted their pharmacy? Yes.   (Agent: If no, request that the patient contact the pharmacy for the refill. If patient does not wish to contact the pharmacy document the reason why and proceed with request.) (Agent: If yes, when and what did the pharmacy advise?)  Preferred Pharmacy (with phone number or street name): [facility] [id number], [place] [address] Phone: [phone] Fax: [phone] Hours: Not open 24 hours   Has the patient been seen for an appointment in the last year OR does the patient have an upcoming appointment? Yes.    Agent: Please be advised that RX refills may take up to 3 business days. We ask that you follow-up with your pharmacy.

## 2022-10-12 MED ORDER — OMEPRAZOLE 20 MG PO CPDR: DELAYED_RELEASE_CAPSULE | ORAL | 3 refills | Status: DC

## 2022-10-12 MED ORDER — ROSUVASTATIN CALCIUM 10 MG PO TABS: 10.0000 mg | ORAL_TABLET | Freq: Every day | ORAL | 3 refills | Status: DC

## 2022-10-12 MED ORDER — BISOPROLOL-HYDROCHLOROTHIAZIDE 5-6.25 MG PO TABS: 1.0000 | ORAL_TABLET | Freq: Every day | ORAL | 3 refills | Status: DC

## 2022-10-12 NOTE — Telephone Encounter (Signed)
Patient request transfer to alternative pharmacy- Rx forwarded. Requested Prescriptions  Pending Prescriptions Disp Refills   bisoprolol-hydrochlorothiazide (ZIAC) 5-6.25 MG tablet 90 tablet 3    Sig: Take 1 tablet by mouth daily.     Cardiovascular: Beta Blocker + Diuretic Combos Failed - 10/11/2022  2:42 PM      Failed - K in normal range and within 180 days    Potassium  Date Value Ref Range Status  10/07/2021 4.7 3.5 - 5.2 mmol/L Final         Failed - Na in normal range and within 180 days    Sodium  Date Value Ref Range Status  10/07/2021 144 134 - 144 mmol/L Final         Failed - Cr in normal range and within 180 days    Creatinine, Ser  Date Value Ref Range Status  10/07/2021 0.79 0.57 - 1.00 mg/dL Final         Failed - eGFR in normal range and within 180 days    GFR calc Af Amer  Date Value Ref Range Status  02/25/2020 92 >59 mL/min/1.73 Final    Comment:    **[facility] currently reports eGFR in compliance with the current**   recommendations of the [employer]. [facility] will   update reporting as new guidelines are published from the NKF-ASN   Task force.    GFR calc non Af Amer  Date Value Ref Range Status  02/25/2020 80 >59 mL/min/1.73 Final   eGFR  Date Value Ref Range Status  10/07/2021 88 >59 mL/min/1.73 Final         Passed - Last BP in normal range    BP Readings from Last 1 Encounters:  10/11/22 127/89         Passed - Last Heart Rate in normal range    Pulse Readings from Last 1 Encounters:  10/11/22 72         Passed - Valid encounter within last 6 months    Recent Outpatient Visits           Yesterday Annual physical exam   [place] Eulis Foster, MD   1 year ago Annual physical exam   [place] Eulas Post, MD   1 year ago Essential (primary) hypertension   [place] Eulas Post, MD   2 years ago Annual  physical exam   [place] Eulas Post, MD   2 years ago [place] Eulas Post, MD       Future Appointments             In 2 months Simmons-Robinson, Riki Sheer, MD [facility], [place]   In 1 year Simmons-Robinson, L[place], MD [place], [facility]             rosuvastatin (CRESTOR) 10 MG tablet 90 tablet 3    Sig: Take 1 tablet (10 mg total) by mouth daily.     Cardiovascular:  Antilipid - Statins 2 Failed - 10/11/2022  2:42 PM      Failed - Cr in normal range and within 360 days    Creatinine, Ser  Date Value Ref Range Status  10/07/2021 0.79 0.57 - 1.00 mg/dL Final         Failed - Lipid Panel in normal range within the last 12 months    Cholesterol,  Total  Date Value Ref Range Status  10/07/2021 173 100 - 199 mg/dL Final   LDL Chol Calc (NIH)  Date Value Ref Range Status  10/07/2021 102 (H) 0 - 99 mg/dL Final   HDL  Date Value Ref Range Status  10/07/2021 42 >39 mg/dL Final   Triglycerides  Date Value Ref Range Status  10/07/2021 163 (H) 0 - 149 mg/dL Final         Passed - Patient is not pregnant      Passed - Valid encounter within last 12 months    Recent Outpatient Visits           Yesterday Annual physical exam   [place] Eulis Foster, MD   1 year ago Annual physical exam   [place] Eulas Post, MD   1 year ago Essential (primary) hypertension   [place] Eulas Post, MD   2 years ago Annual physical exam   [place] Eulas Post, MD   2 years ago [place] Eulas Post, MD       Future Appointments             In 2 months Simmons-Robinson, Riki Sheer, MD [facility] [place], [place]   In 1  year Simmons-Robinson, [place], MD [place], [facility]             omeprazole (PRILOSEC) 20 MG capsule 90 capsule 3    Sig: TAKE 1 CAPSULE BY MOUTH EVERY DAY     Gastroenterology: Proton Pump Inhibitors Passed - 10/11/2022  2:42 PM      Passed - Valid encounter within last 12 months    Recent Outpatient Visits           Yesterday Annual physical exam   [place] [facility] Eulis Foster, MD   1 year ago Annual physical exam   [place] Eulas Post, MD   1 year ago Essential (primary) hypertension   [place] Eulas Post, MD   2 years ago Annual physical exam   [place] Eulas Post, MD   2 years ago [place] Eulas Post, MD       Future Appointments             In 2 months Simmons-Robinson, Riki Sheer, [place], [place]   In 1 year Simmons-Robinson, [place], MD [facility], [place]

## 2022-10-12 NOTE — Telephone Encounter (Signed)
Requested medication (s) are due for refill today: yes  Requested medication (s) are on the active medication list: yes  Last refill:  03/10/22  Future visit scheduled: yes  Notes to clinic:  Unable to refill per protocol due to failed labs, no updated results.      Requested Prescriptions  Pending Prescriptions Disp Refills   naproxen (EC-NAPROXEN) 500 MG EC tablet 60 tablet 0    Sig: Take 1 tablet (500 mg total) by mouth 2 (two) times daily with a meal.     Analgesics:  NSAIDS Failed - 10/11/2022  3:32 PM      Failed - Manual Review: Labs are only required if the patient has taken medication for more than 8 weeks.      Failed - Cr in normal range and within 360 days    Creatinine, Ser  Date Value Ref Range Status  10/07/2021 0.79 0.57 - 1.00 mg/dL Final         Failed - HGB in normal range and within 360 days    Hemoglobin  Date Value Ref Range Status  10/07/2021 11.9 11.1 - 15.9 g/dL Final         Failed - PLT in normal range and within 360 days    Platelets  Date Value Ref Range Status  10/07/2021 308 150 - 450 x10E3/uL Final         Failed - HCT in normal range and within 360 days    Hematocrit  Date Value Ref Range Status  10/07/2021 35.3 34.0 - 46.6 % Final         Failed - eGFR is 30 or above and within 360 days    GFR calc Af Amer  Date Value Ref Range Status  02/25/2020 92 >59 mL/min/1.73 Final    Comment:    **[facility] currently reports eGFR in compliance with the current**   recommendations of the [employer]. [facility] will   update reporting as new guidelines are published from the NKF-ASN   Task force.    GFR calc non Af Amer  Date Value Ref Range Status  02/25/2020 80 >59 mL/min/1.73 Final   eGFR  Date Value Ref Range Status  10/07/2021 88 >59 mL/min/1.73 Final         Passed - Patient is not pregnant      Passed - Valid encounter within last 12 months    Recent Outpatient Visits           Yesterday Annual physical exam    [facility] Eulis Foster, MD   1 year ago Annual physical exam   [place] Eulas Post, MD   1 year ago Essential (primary) hypertension   [place] Eulas Post, MD   2 years ago Annual physical exam   [facility] Eulas Post, MD   2 years ago [place] Eulas Post, MD       Future Appointments             In 2 months Simmons-Robinson, Riki Sheer, MD [facility], [place]   In 1 year Simmons-Robinson, [place], MD [facility], [place]

## 2022-10-13 MED ORDER — NAPROXEN 500 MG PO TBEC: 500.0000 mg | DELAYED_RELEASE_TABLET | Freq: Two times a day (BID) | ORAL | 0 refills | Status: DC

## 2022-10-14 DIAGNOSIS — Z1159 Encounter for screening for other viral diseases: Secondary | ICD-10-CM | POA: Diagnosis not present

## 2022-10-14 DIAGNOSIS — I10 Essential (primary) hypertension: Secondary | ICD-10-CM | POA: Diagnosis not present

## 2022-10-14 DIAGNOSIS — Z23 Encounter for immunization: Secondary | ICD-10-CM | POA: Insufficient documentation

## 2022-10-14 DIAGNOSIS — Z114 Encounter for screening for human immunodeficiency virus [HIV]: Secondary | ICD-10-CM | POA: Diagnosis not present

## 2022-10-14 DIAGNOSIS — Z0000 Encounter for general adult medical examination without abnormal findings: Secondary | ICD-10-CM | POA: Diagnosis not present

## 2022-10-14 DIAGNOSIS — K219 Gastro-esophageal reflux disease without esophagitis: Secondary | ICD-10-CM | POA: Insufficient documentation

## 2022-10-14 NOTE — Assessment & Plan Note (Signed)
Chronic  Obesity related to excess calories  We discussed recommendations for regular physical activity aiming for 150 mins per week along with consuming diet with rich vegetables and protein  CMP collected, A1c ordered, lipid panel collected

## 2022-10-14 NOTE — Assessment & Plan Note (Signed)
Shingrix vaccine administered today  Patient was observed after vaccine and tolerated the injection well

## 2022-10-14 NOTE — Assessment & Plan Note (Signed)
HIV screening ordered

## 2022-10-14 NOTE — Assessment & Plan Note (Signed)
Hep C screening ordered today

## 2022-10-15 LAB — COMPREHENSIVE METABOLIC PANEL
ALT: 36 IU/L — ABNORMAL HIGH (ref 0–32)
AST: 29 IU/L (ref 0–40)
Albumin/Globulin Ratio: 1.5 (ref 1.2–2.2)
Albumin: 4.3 g/dL (ref 3.8–4.9)
Alkaline Phosphatase: 110 IU/L (ref 44–121)
BUN/Creatinine Ratio: 17 (ref 9–23)
BUN: 12 mg/dL (ref 6–24)
Bilirubin Total: 0.3 mg/dL (ref 0.0–1.2)
CO2: 21 mmol/L (ref 20–29)
Calcium: 9.8 mg/dL (ref 8.7–10.2)
Chloride: 97 mmol/L (ref 96–106)
Creatinine, Ser: 0.72 mg/dL (ref 0.57–1.00)
Globulin, Total: 2.9 g/dL (ref 1.5–4.5)
Glucose: 97 mg/dL (ref 70–99)
Potassium: 4.3 mmol/L (ref 3.5–5.2)
Sodium: 137 mmol/L (ref 134–144)
Total Protein: 7.2 g/dL (ref 6.0–8.5)
eGFR: 98 mL/min/{1.73_m2} (ref >59)

## 2022-10-15 LAB — LIPID PANEL
Chol/HDL Ratio: 4.5 ratio — ABNORMAL HIGH (ref 0.0–4.4)
Cholesterol, Total: 178 mg/dL (ref 100–199)
HDL: 40 mg/dL (ref >39)
LDL Chol Calc (NIH): 103 mg/dL — ABNORMAL HIGH (ref 0–99)
Triglycerides: 202 mg/dL — ABNORMAL HIGH (ref 0–149)
VLDL Cholesterol Cal: 35 mg/dL (ref 5–40)

## 2022-10-15 LAB — HEPATITIS C ANTIBODY: Hep C Virus Ab: NONREACTIVE

## 2022-10-15 LAB — HIV ANTIBODY (ROUTINE TESTING W REFLEX): HIV Screen 4th Generation wRfx: NONREACTIVE

## 2022-10-15 LAB — HEMOGLOBIN A1C
Est. average glucose Bld gHb Est-mCnc: 143 mg/dL
Hgb A1c MFr Bld: 6.6 % — ABNORMAL HIGH (ref 4.8–5.6)

## 2022-10-17 ENCOUNTER — Other Ambulatory Visit: Payer: Self-pay

## 2022-10-17 DIAGNOSIS — E7800 Pure hypercholesterolemia, unspecified: Secondary | ICD-10-CM

## 2022-10-17 MED ORDER — ROSUVASTATIN CALCIUM 20 MG PO TABS: 10.0000 mg | ORAL_TABLET | Freq: Every day | ORAL | 0 refills | Status: DC

## 2022-11-10 ENCOUNTER — Other Ambulatory Visit: Payer: Self-pay | Admitting: Family Medicine

## 2022-11-10 DIAGNOSIS — M722 Plantar fascial fibromatosis: Secondary | ICD-10-CM

## 2022-11-14 ENCOUNTER — Telehealth: Payer: Self-pay | Admitting: Family Medicine

## 2022-11-14 ENCOUNTER — Other Ambulatory Visit: Payer: Self-pay

## 2022-11-14 DIAGNOSIS — M722 Plantar fascial fibromatosis: Secondary | ICD-10-CM

## 2022-11-14 NOTE — Telephone Encounter (Signed)
[facility]pharmacy faxed refill request for the following medications:   Naproxen DR 500 MG TBEC     Please advise

## 2022-11-28 ENCOUNTER — Other Ambulatory Visit: Payer: Self-pay | Admitting: Obstetrics and Gynecology

## 2022-11-28 DIAGNOSIS — Z1231 Encounter for screening mammogram for malignant neoplasm of breast: Secondary | ICD-10-CM

## 2022-11-28 DIAGNOSIS — Z01419 Encounter for gynecological examination (general) (routine) without abnormal findings: Secondary | ICD-10-CM | POA: Diagnosis not present

## 2022-11-28 DIAGNOSIS — Z1211 Encounter for screening for malignant neoplasm of colon: Secondary | ICD-10-CM | POA: Diagnosis not present

## 2022-12-01 ENCOUNTER — Other Ambulatory Visit: Payer: Self-pay | Admitting: Family Medicine

## 2022-12-01 DIAGNOSIS — M722 Plantar fascial fibromatosis: Secondary | ICD-10-CM

## 2022-12-01 MED ORDER — NAPROXEN DR 500 MG PO TBEC: DELAYED_RELEASE_TABLET | ORAL | 6 refills | Status: DC

## 2022-12-29 DIAGNOSIS — Z1211 Encounter for screening for malignant neoplasm of colon: Secondary | ICD-10-CM | POA: Diagnosis not present

## 2023-01-02 NOTE — Progress Notes (Unsigned)
I,Karen Henry,acting as a scribe for [employer], MD.,have documented all relevant documentation on the behalf of Karen Ramp, MD,as directed by  Karen Ramp, MD while in the presence of Karen Ramp, MD.   Established patient visit   Patient: Karen Henry   DOB: Jun 11, 1966   57 y.o. Female  MRN: [id number] Visit Date: 01/03/2023  Today's healthcare provider: [name], MD   Chief Complaint  Patient presents with   Follow-up   Subjective    HPI  Diabetes Mellitus Type II  Lab Results  Component Value Date   HGBA1C 6.6 (H) 10/14/2022   Wt Readings from Last 3 Encounters:  01/03/23 169 lb 3.2 oz (76.7 kg)  10/11/22 169 lb 11.2 oz (77 kg)  10/07/21 167 lb (75.8 kg)   Last seen for diabetes 3 months ago.  Management since then includes starting metformin 500mg  daily for 3 months.  Patient elected to try diet and exercise. She reports excellent compliance with treatment. She is not having side effects.   Symptoms: No fatigue No foot ulcerations  No appetite changes No nausea  No paresthesia of the feet  No polydipsia  No polyuria No visual disturbances   No vomiting      Current insulin regiment: none  Pertinent Labs: Lab Results  Component Value Date   CHOL 178 10/14/2022   HDL 40 10/14/2022   LDLCALC 103 (H) 10/14/2022   TRIG 202 (H) 10/14/2022   CHOLHDL 4.5 (H) 10/14/2022   Lab Results  Component Value Date   NA 137 10/14/2022   K 4.3 10/14/2022   CREATININE 0.72 10/14/2022   EGFR 98 10/14/2022     ---------------------------------------------------------------------------------------------------   Lipid/Cholesterol, Follow-up  Last lipid panel Other pertinent labs  Lab Results  Component Value Date   CHOL 178 10/14/2022   HDL 40 10/14/2022   LDLCALC 103 (H) 10/14/2022   TRIG 202 (H) 10/14/2022   CHOLHDL 4.5 (H) 10/14/2022   Lab Results  Component Value Date   ALT 36  (H) 10/14/2022   AST 29 10/14/2022   PLT 308 10/07/2021   TSH 1.520 10/07/2021     She was last seen for this 3 months ago.  Management since that visit includes recommended increasing rosuvastatin to 20mg  daily along with dietary changes.  She reports excellent compliance with treatment. She is not having side effects.    The 10-year ASCVD risk score (Arnett DK, et al., 2019) is: 2.9%  --------------------------------------------------------------------------------------------------- HTN: well controlled, she denies symptoms including chest discomfort, HA or leg swelling at this time. Reports regular adherence to Ziac 5-6.25mg  daily.   Medications: Outpatient Medications Prior to Visit  Medication Sig   aspirin 81 MG tablet Take by mouth.   Bioflavonoid Products (VITAMIN C) CHEW Chew by mouth.   bisoprolol-hydrochlorothiazide (ZIAC) 5-6.25 MG tablet Take 1 tablet by mouth daily.   Cetirizine HCl 10 MG CAPS Take by mouth.   Cholecalciferol (VITAMIN D3) 25 MCG (1000 UT) CAPS Take 1,000 Units by mouth daily.   COENZYME Q-10 PO Take by mouth.   diphenhydrAMINE HCl (BENADRYL ALLERGY PO) Take by mouth at bedtime.   Naproxen DR 500 MG TBEC TAKE 1 TABLET(500 MG) BY MOUTH TWICE DAILY WITH A MEAL   omeprazole (PRILOSEC) 20 MG capsule TAKE 1 CAPSULE BY MOUTH EVERY DAY   rosuvastatin (CRESTOR) 20 MG tablet Take 0.5 tablets (10 mg total) by mouth daily.   triamcinolone (KENALOG) 0.1 % APPLY 1 APPLICATION TOPICALLY 2 (TWO) TIMES DAILY. APPLY  1 APPLICATION 2 TIMES DAILY TO FACIAL PATCHES OF DERMATITIS   Bioflavonoid Products (BIOFLEX PO) Take by mouth every other day.   No facility-administered medications prior to visit.    Review of Systems     Objective    BP 117/79 (BP Location: Left Arm, Patient Position: Sitting, Cuff Size: Normal)   Pulse 68   Temp 98.4 F (36.9 C) (Oral)   Resp 16   Wt 169 lb 3.2 oz (76.7 kg)   BMI 29.97 kg/m    Physical Exam Vitals reviewed.   Constitutional:      General: She is not in acute distress.    Appearance: Normal appearance. She is not ill-appearing, toxic-appearing or diaphoretic.  Eyes:     General: No scleral icterus.    Conjunctiva/sclera: Conjunctivae normal.  Cardiovascular:     Rate and Rhythm: Normal rate and regular rhythm.     Pulses: Normal pulses.     Heart sounds: Normal heart sounds. No murmur heard.    No friction rub. No gallop.  Pulmonary:     Effort: Pulmonary effort is normal. No respiratory distress.     Breath sounds: Normal breath sounds. No stridor. No wheezing, rhonchi or rales.  Abdominal:     General: Bowel sounds are normal. There is no distension.     Palpations: Abdomen is soft.     Tenderness: There is no abdominal tenderness.  Musculoskeletal:     Right lower leg: No edema.     Left lower leg: No edema.  Neurological:     Mental Status: She is alert and oriented to person, place, and time.      No results found for any visits on 01/03/23.  Assessment & Plan     Problem List Items Addressed This Visit       Cardiovascular and Mediastinum   Essential (primary) hypertension    Controlled BP at goal Continue current medications at current doses CMP collected today           Other   Hypercholesteremia    Chronic problem  LDL and TG elevated previously  She will continue crestor 20mg  daily  Lipid panel collected today           Relevant Orders   Lipid Profile   Need for shingles vaccine    Shingrix vaccine was administered today, patient tolerated vaccine well without immediate complications        Relevant Orders   Varicella-zoster vaccine IM   Elevated hemoglobin A1c - Primary    Newly elevated A1c  Recommended metformin Patient has decreased sugar intake and been walking on regular basis  Will repeat A1c today, recommend medication if A1c remains elevated above 6.5% Continue diet and physical activity management of glucose for now  F/u in 3  months       Relevant Orders   Hemoglobin A1c   Elevated liver enzymes    Elevated liver enzymes  Will repeat CMP today        Relevant Orders   Comprehensive metabolic panel   [id number] vaccine dose declined    Patient declines covid vaccine         Return in about 3 months (around 04/05/2023) for DM, BP, HLD.        The entirety of the information documented in the History of Present Illness, Review of Systems and Physical Exam were personally obtained by me. Portions of this information were initially documented by Hetty Ely, CMA. I, Karen Ramp, MD have  reviewed the documentation above for thoroughness and accuracy.      Karen Ramp, MD  [facility] [phone] (phone) [phone] (fax)  [facility]

## 2023-01-03 ENCOUNTER — Encounter: Payer: Self-pay | Admitting: Family Medicine

## 2023-01-03 ENCOUNTER — Ambulatory Visit (INDEPENDENT_AMBULATORY_CARE_PROVIDER_SITE_OTHER): Payer: BC Managed Care – PPO | Admitting: Family Medicine

## 2023-01-03 VITALS — BP 117/79 | HR 68 | Temp 98.4°F | Resp 16 | Wt 169.2 lb

## 2023-01-03 DIAGNOSIS — R7309 Other abnormal glucose: Secondary | ICD-10-CM | POA: Insufficient documentation

## 2023-01-03 DIAGNOSIS — Z23 Encounter for immunization: Secondary | ICD-10-CM | POA: Diagnosis not present

## 2023-01-03 DIAGNOSIS — R748 Abnormal levels of other serum enzymes: Secondary | ICD-10-CM | POA: Insufficient documentation

## 2023-01-03 DIAGNOSIS — E7800 Pure hypercholesterolemia, unspecified: Secondary | ICD-10-CM

## 2023-01-03 DIAGNOSIS — I10 Essential (primary) hypertension: Secondary | ICD-10-CM | POA: Diagnosis not present

## 2023-01-03 DIAGNOSIS — Z2821 Immunization not carried out because of patient refusal: Secondary | ICD-10-CM

## 2023-01-03 NOTE — Assessment & Plan Note (Signed)
Shingrix vaccine was administered today, patient tolerated vaccine well without immediate complications

## 2023-01-03 NOTE — Assessment & Plan Note (Addendum)
Patient declines covid vaccine

## 2023-01-03 NOTE — Assessment & Plan Note (Signed)
Controlled BP at goal Continue current medications at current doses CMP collected today

## 2023-01-03 NOTE — Assessment & Plan Note (Signed)
Chronic problem  LDL and TG elevated previously  She will continue crestor 20mg  daily  Lipid panel collected today

## 2023-01-03 NOTE — Assessment & Plan Note (Signed)
Elevated liver enzymes  Will repeat CMP today

## 2023-01-03 NOTE — Assessment & Plan Note (Signed)
Newly elevated A1c  Recommended metformin Patient has decreased sugar intake and been walking on regular basis  Will repeat A1c today, recommend medication if A1c remains elevated above 6.5% Continue diet and physical activity management of glucose for now  F/u in 3 months

## 2023-01-04 LAB — COMPREHENSIVE METABOLIC PANEL
ALT: 26 IU/L (ref 0–32)
AST: 21 IU/L (ref 0–40)
Albumin/Globulin Ratio: 1.6 (ref 1.2–2.2)
Albumin: 4.4 g/dL (ref 3.8–4.9)
Alkaline Phosphatase: 102 IU/L (ref 44–121)
BUN/Creatinine Ratio: 21 (ref 9–23)
BUN: 16 mg/dL (ref 6–24)
Bilirubin Total: 0.3 mg/dL (ref 0.0–1.2)
CO2: 23 mmol/L (ref 20–29)
Calcium: 9.7 mg/dL (ref 8.7–10.2)
Chloride: 102 mmol/L (ref 96–106)
Creatinine, Ser: 0.76 mg/dL (ref 0.57–1.00)
Globulin, Total: 2.7 g/dL (ref 1.5–4.5)
Glucose: 107 mg/dL — ABNORMAL HIGH (ref 70–99)
Potassium: 4.6 mmol/L (ref 3.5–5.2)
Sodium: 140 mmol/L (ref 134–144)
Total Protein: 7.1 g/dL (ref 6.0–8.5)
eGFR: 92 mL/min/{1.73_m2} (ref >59)

## 2023-01-04 LAB — LIPID PANEL
Chol/HDL Ratio: 3.7 ratio (ref 0.0–4.4)
Cholesterol, Total: 149 mg/dL (ref 100–199)
HDL: 40 mg/dL (ref >39)
LDL Chol Calc (NIH): 83 mg/dL (ref 0–99)
Triglycerides: 147 mg/dL (ref 0–149)
VLDL Cholesterol Cal: 26 mg/dL (ref 5–40)

## 2023-01-04 LAB — HEMOGLOBIN A1C
Est. average glucose Bld gHb Est-mCnc: 134 mg/dL
Hgb A1c MFr Bld: 6.3 % — ABNORMAL HIGH (ref 4.8–5.6)

## 2023-01-09 ENCOUNTER — Telehealth: Payer: Self-pay | Admitting: Family Medicine

## 2023-01-09 ENCOUNTER — Encounter: Payer: Self-pay | Admitting: Family Medicine

## 2023-01-09 ENCOUNTER — Ambulatory Visit
Admission: RE | Admit: 2023-01-09 | Discharge: 2023-01-09 | Disposition: A | Discharge status: Home / Self Care | Payer: BC Managed Care – PPO | Source: Ambulatory Visit | Attending: Obstetrics and Gynecology | Admitting: Obstetrics and Gynecology

## 2023-01-09 DIAGNOSIS — I10 Essential (primary) hypertension: Secondary | ICD-10-CM

## 2023-01-09 DIAGNOSIS — Z1231 Encounter for screening mammogram for malignant neoplasm of breast: Secondary | ICD-10-CM | POA: Diagnosis not present

## 2023-01-09 DIAGNOSIS — E7800 Pure hypercholesterolemia, unspecified: Secondary | ICD-10-CM

## 2023-01-09 MED ORDER — ROSUVASTATIN CALCIUM 20 MG PO TABS: 10.0000 mg | ORAL_TABLET | Freq: Every day | ORAL | 2 refills | Status: DC

## 2023-01-09 MED ORDER — BISOPROLOL-HYDROCHLOROTHIAZIDE 5-6.25 MG PO TABS: 1.0000 | ORAL_TABLET | Freq: Every day | ORAL | 3 refills | Status: DC

## 2023-01-09 NOTE — Telephone Encounter (Signed)
[facility] faxed refill request for the following medications:   bisoprolol-hydrochlorothiazide (ZIAC) 5-6.25 MG tablet   Please advise

## 2023-01-09 NOTE — Telephone Encounter (Signed)
[facility] faxed refill request for the following medications:    rosuvastatin (CRESTOR) 20 MG tablet     Please advise

## 2023-01-09 NOTE — Addendum Note (Signed)
Addended by: Bing Neighbors on: 01/09/2023 08:42 AM   Modules accepted: Orders

## 2023-01-11 ENCOUNTER — Other Ambulatory Visit: Payer: Self-pay

## 2023-01-11 ENCOUNTER — Telehealth: Payer: Self-pay | Admitting: Family Medicine

## 2023-01-11 DIAGNOSIS — E7800 Pure hypercholesterolemia, unspecified: Secondary | ICD-10-CM

## 2023-01-11 MED ORDER — ROSUVASTATIN CALCIUM 20 MG PO TABS: 10.0000 mg | ORAL_TABLET | Freq: Every day | ORAL | 2 refills | Status: DC

## 2023-01-11 NOTE — Telephone Encounter (Signed)
Send to [facility]( not [facility]pharmacy faxed refill request for the following medications:   rosuvastatin (CRESTOR) 20 MG tablet     Please advise

## 2023-01-24 ENCOUNTER — Telehealth: Payer: Self-pay | Admitting: Family Medicine

## 2023-01-24 ENCOUNTER — Other Ambulatory Visit: Payer: Self-pay

## 2023-01-24 DIAGNOSIS — E7800 Pure hypercholesterolemia, unspecified: Secondary | ICD-10-CM

## 2023-01-24 DIAGNOSIS — I10 Essential (primary) hypertension: Secondary | ICD-10-CM

## 2023-01-24 MED ORDER — ROSUVASTATIN CALCIUM 20 MG PO TABS: 10.0000 mg | ORAL_TABLET | Freq: Every day | ORAL | 2 refills | Status: DC

## 2023-01-24 MED ORDER — BISOPROLOL-HYDROCHLOROTHIAZIDE 5-6.25 MG PO TABS: 1.0000 | ORAL_TABLET | Freq: Every day | ORAL | 3 refills | Status: DC

## 2023-01-24 NOTE — Telephone Encounter (Signed)
[facility] faxed refill request for the following medications:   bisoprolol-hydrochlorothiazide (ZIAC) 5-6.25 MG tablet     rosuvastatin (CRESTOR) 20 MG tablet     Please advise

## 2023-03-14 ENCOUNTER — Telehealth: Payer: Self-pay | Admitting: Family Medicine

## 2023-03-14 DIAGNOSIS — M722 Plantar fascial fibromatosis: Secondary | ICD-10-CM

## 2023-03-14 NOTE — Telephone Encounter (Signed)
The patient called in stating her mail order pharmacy [facility] now requires a prior authorization for her to be able to receive her Naproxen DR 500 MG TBEC . She has a case number from a request submitted and that is #[id number]. She states she normally gets a 90 day supply but they have down a 30 day supply and she is not sure why as it should be 90 days. Please assist patient further so she can get her meds.    [facility] - [place], [place] - [address] Phone: [phone]  Fax: [phone]     Her next time for renewal is not until September but she has to have approval before then.

## 2023-03-30 MED ORDER — NAPROXEN DR 500 MG PO TBEC: DELAYED_RELEASE_TABLET | ORAL | 2 refills | Status: DC

## 2023-03-30 NOTE — Telephone Encounter (Signed)
Pharmacist reports that they just need a new rx for #90 day supply. New Rx sent.

## 2023-03-30 NOTE — Telephone Encounter (Signed)
Karen Henry (Key: [id number]) PA Case ID #: [id number] Need Help? Call us at [phone] Outcome Additional Information Required This medication or product is on your plan's list of covered drugs. Prior authorization is not required at this time. If your pharmacy has questions regarding the processing of your prescription, please have them call the [facility] pharmacy help desk at [phone]. **Please note: This request was submitted electronically. Formulary lowering, tiering exception, cost reduction and/or pre-benefit determination review (including prospective Medicare hospice reviews) requests cannot be requested using this method of submission. Providers contact us at [phone] for further assistance.

## 2023-04-03 ENCOUNTER — Other Ambulatory Visit: Payer: Self-pay

## 2023-04-03 ENCOUNTER — Telehealth: Payer: Self-pay | Admitting: Family Medicine

## 2023-04-03 DIAGNOSIS — K219 Gastro-esophageal reflux disease without esophagitis: Secondary | ICD-10-CM

## 2023-04-03 MED ORDER — OMEPRAZOLE 20 MG PO CPDR: DELAYED_RELEASE_CAPSULE | ORAL | 3 refills | Status: DC

## 2023-04-03 NOTE — Telephone Encounter (Signed)
[facility] faxed refill request for the following medications:   omeprazole (PRILOSEC) 20 MG capsule    Please advise.

## 2023-04-06 ENCOUNTER — Encounter: Payer: Self-pay | Admitting: Family Medicine

## 2023-04-06 ENCOUNTER — Ambulatory Visit (INDEPENDENT_AMBULATORY_CARE_PROVIDER_SITE_OTHER): Payer: BC Managed Care – PPO | Admitting: Family Medicine

## 2023-04-06 VITALS — BP 115/65 | HR 74 | Temp 98.6°F | Ht 63.0 in | Wt 167.0 lb

## 2023-04-06 DIAGNOSIS — R7303 Prediabetes: Secondary | ICD-10-CM | POA: Diagnosis not present

## 2023-04-06 DIAGNOSIS — M25562 Pain in left knee: Secondary | ICD-10-CM

## 2023-04-06 DIAGNOSIS — G8929 Other chronic pain: Secondary | ICD-10-CM

## 2023-04-06 DIAGNOSIS — E7800 Pure hypercholesterolemia, unspecified: Secondary | ICD-10-CM | POA: Diagnosis not present

## 2023-04-06 DIAGNOSIS — I10 Essential (primary) hypertension: Secondary | ICD-10-CM

## 2023-04-06 DIAGNOSIS — E559 Vitamin D deficiency, unspecified: Secondary | ICD-10-CM

## 2023-04-06 DIAGNOSIS — M25551 Pain in right hip: Secondary | ICD-10-CM | POA: Diagnosis not present

## 2023-04-06 DIAGNOSIS — M25561 Pain in right knee: Secondary | ICD-10-CM

## 2023-04-06 LAB — POCT GLYCOSYLATED HEMOGLOBIN (HGB A1C): Hemoglobin A1C: 6.2 % — AB (ref 4.0–5.6)

## 2023-04-06 MED ORDER — ROSUVASTATIN CALCIUM 20 MG PO TABS: 20.0000 mg | ORAL_TABLET | Freq: Every day | ORAL | Status: DC

## 2023-04-06 NOTE — Assessment & Plan Note (Signed)
Chronic  Improved on NSAIDS  Continue naproxen 500mg  twice daily PRN

## 2023-04-06 NOTE — Assessment & Plan Note (Signed)
A1c decreased from 6.3 to 6.2 with lifestyle modifications. Patient has lost 2 pounds since last visit. Discussed the importance of diet and exercise in managing blood glucose levels. -Chronic, improved A1c  -Continue lifestyle modifications focusing on diet and exercise. -no new medications prescribed today  -Check A1c in 6 months.

## 2023-04-06 NOTE — Progress Notes (Addendum)
Established patient visit   Patient: Karen Henry   DOB: [birth year]   56 y.o. Female  MRN: [id number] Visit Date: 04/06/2023  Today's healthcare provider: [name], MD   Chief Complaint  Patient presents with   Diabetes    Patient was last seen in May.  Her A1 C at that time was 6.3.  It was recommended she start Metformin however, patient wanted to wait on taking medications.    Subjective     HPI     Diabetes    Additional comments: Patient was last seen in May.  Her A1 C at that time was 6.3.  It was recommended she start Metformin however, patient wanted to wait on taking medications.       Last edited by Adline Peals, CMA on 04/06/2023  8:15 AM.       Discussed the use of AI scribe software for clinical note transcription with the patient, who gave verbal consent to proceed.  History of Present Illness   The patient, with a history of prediabetes, hypertension, and vitamin D deficiency, presents for a follow-up visit.    PreDiabetes She reports a recent focus on diet and exercise, with some improvement in her A1c from 6.3 to 6.2, now classifying her in the prediabetes range. She has also lost 2 pounds since her last visit in May. Despite these improvements, she acknowledges that she could focus more on her diet. The patient expresses a desire for more information on managing her A1c through diet, specifically seeking alternatives to sweet foods. She mentions popcorn and nuts as potential substitutes. She also inquires about the glycemic index of various fruits.  The patient is currently on Ziac 5-6.25 mg daily for hypertension, vitamin D 1000 units daily, and Crestor 20 mg daily for cholesterol management. She reports no issues with these medications.   chronic Hip and knee pain  She also takes naproxen 500mg  twice daily as needed for right hip and bilateral knee pain, which she attributes to a previous fall and overuse. She reports that the  naproxen is effective in managing her pain. She does not report trying other medications since the naproxen helps her pain.         Medications: Outpatient Medications Prior to Visit  Medication Sig   aspirin 81 MG tablet Take by mouth.   Bioflavonoid Products (VITAMIN C) CHEW Chew by mouth.   bisoprolol-hydrochlorothiazide (ZIAC) 5-6.25 MG tablet Take 1 tablet by mouth daily.   Cetirizine HCl 10 MG CAPS Take by mouth.   Cholecalciferol (VITAMIN D3) 25 MCG (1000 UT) CAPS Take 1,000 Units by mouth daily.   COENZYME Q-10 PO Take by mouth.   diphenhydrAMINE HCl (BENADRYL ALLERGY PO) Take by mouth at bedtime.   Naproxen DR 500 MG TBEC TAKE 1 TABLET(500 MG) BY MOUTH TWICE DAILY WITH A MEAL   omeprazole (PRILOSEC) 20 MG capsule TAKE 1 CAPSULE BY MOUTH EVERY DAY   triamcinolone (KENALOG) 0.1 % APPLY 1 APPLICATION TOPICALLY 2 (TWO) TIMES DAILY. APPLY 1 APPLICATION 2 TIMES DAILY TO FACIAL PATCHES OF DERMATITIS   [DISCONTINUED] rosuvastatin (CRESTOR) 20 MG tablet Take 0.5 tablets (10 mg total) by mouth daily.   No facility-administered medications prior to visit.    Review of Systems      Objective    BP 115/65 (BP Location: Right Arm, Patient Position: Sitting, Cuff Size: Normal)   Pulse 74   Temp 98.6 F (37 C) (Oral)   Ht 5\' 3"  (  1.6 m)   Wt 167 lb (75.8 kg)   SpO2 96%   BMI 29.58 kg/m     Physical Exam Vitals reviewed.  Constitutional:      General: She is not in acute distress.    Appearance: Normal appearance. She is not ill-appearing, toxic-appearing or diaphoretic.  Eyes:     Conjunctiva/sclera: Conjunctivae normal.  Cardiovascular:     Rate and Rhythm: Normal rate and regular rhythm.     Pulses: Normal pulses.     Heart sounds: Normal heart sounds. No murmur heard.    No friction rub. No gallop.  Pulmonary:     Effort: Pulmonary effort is normal. No respiratory distress.     Breath sounds: Normal breath sounds. No stridor. No wheezing, rhonchi or rales.   Abdominal:     General: Bowel sounds are normal. There is no distension.     Palpations: Abdomen is soft.     Tenderness: There is no abdominal tenderness.  Musculoskeletal:     Right hip: No tenderness. Normal range of motion. Normal strength.     Left hip: Normal.     Right knee: Normal. No swelling, bony tenderness or crepitus. Normal range of motion. No tenderness.     Left knee: Normal. No swelling, bony tenderness or crepitus. Normal range of motion. No tenderness.     Right lower leg: Normal. No edema.     Left lower leg: Normal. No edema.  Skin:    Findings: No erythema or rash.  Neurological:     Mental Status: She is alert and oriented to person, place, and time.       No results found for any visits on 04/06/23.  Assessment & Plan     Problem List Items Addressed This Visit     Chronic hip pain, right    Chronic  Improved on NSAIDS  Continue naproxen 500mg  twice daily PRN        Chronic pain of both knees    Pain occurs with overuse or prolonged standing on hard surfaces. No pain on examination today. -Chronic, improved on NSAIDS  -will need to complete PA for Naproxen 500mg  up to twice daily PRN  -Continue Naproxen as needed for pain.      Elevated hemoglobin A1c - Primary    A1c decreased from 6.3 to 6.2 with lifestyle modifications. Patient has lost 2 pounds since last visit. Discussed the importance of diet and exercise in managing blood glucose levels. -Chronic, improved A1c  -Continue lifestyle modifications focusing on diet and exercise. -no new medications prescribed today  -Check A1c in 6 months.      Essential (primary) hypertension      Blood pressure well controlled at 115/65. -Chronic, well controlled, at goal  -Continue Ziac 5-6.25mg  daily.      Relevant Medications   rosuvastatin (CRESTOR) 20 MG tablet   Hypercholesteremia    Patient taking Crestor 20mg  daily. -Chronic, stable, last lipid panel in May 2024 was within goal range   -Continue Crestor 20mg  daily.      Relevant Medications   rosuvastatin (CRESTOR) 20 MG tablet   Vitamin D deficiency    Patient taking Vitamin D 1000 units daily. Discussed the option of increasing to 2000 units daily. -Continue Vitamin D 1000 units daily. -Check Vitamin D levels at follow up visit in 6 months                 Return in about 6 months (around 10/07/2023) for A1c, HLD,  HTN .      [name], MD  [facility] [phone] (phone) [phone] (fax)  [facility]

## 2023-04-06 NOTE — Assessment & Plan Note (Signed)
Patient taking Crestor 20mg  daily. -Chronic, stable, last lipid panel in May 2024 was within goal range  -Continue Crestor 20mg  daily.

## 2023-04-06 NOTE — Assessment & Plan Note (Signed)
   Blood pressure well controlled at 115/65. -Chronic, well controlled, at goal  -Continue Ziac 5-6.25mg  daily.

## 2023-04-06 NOTE — Patient Instructions (Signed)
A1c was in pre-diabetes range. This can be improved with dietary management limiting sugary beverages, rice, pasta, bread and desserts and regular physical activity.

## 2023-04-06 NOTE — Assessment & Plan Note (Signed)
Pain occurs with overuse or prolonged standing on hard surfaces. No pain on examination today. -Chronic, improved on NSAIDS  -will need to complete PA for Naproxen 500mg  up to twice daily PRN  -Continue Naproxen as needed for pain.

## 2023-04-06 NOTE — Assessment & Plan Note (Signed)
Patient taking Vitamin D 1000 units daily. Discussed the option of increasing to 2000 units daily. -Continue Vitamin D 1000 units daily. -Check Vitamin D levels at follow up visit in 6 months

## 2023-05-11 ENCOUNTER — Telehealth: Payer: Self-pay | Admitting: Family Medicine

## 2023-05-11 NOTE — Telephone Encounter (Signed)
Received a fax from covermymeds for Naproxen DR 500mg    Key:  [id number]

## 2023-05-16 NOTE — Telephone Encounter (Signed)
PA initiated and faxed notes to be attach.

## 2023-05-22 NOTE — Telephone Encounter (Signed)
Denied on September 19 by [employer] 2017 NCPDP Request Reference Number: [id number]. NAPROXEN DR TAB 500MG  is denied due to Plan Exclusion. For further questions, call [phone]. Drug Naproxen DR 500MG  dr tablets

## 2023-06-07 DIAGNOSIS — H40013 Open angle with borderline findings, low risk, bilateral: Secondary | ICD-10-CM | POA: Diagnosis not present

## 2023-10-10 ENCOUNTER — Ambulatory Visit: Payer: BC Managed Care – PPO | Admitting: Family Medicine

## 2023-10-12 ENCOUNTER — Encounter: Payer: BC Managed Care – PPO | Admitting: Family Medicine

## 2023-10-12 ENCOUNTER — Encounter: Payer: Self-pay | Admitting: Family Medicine

## 2023-10-12 ENCOUNTER — Ambulatory Visit: Payer: BC Managed Care – PPO | Admitting: Family Medicine

## 2023-10-12 VITALS — BP 123/83 | HR 69 | Resp 16 | Ht 62.0 in | Wt 167.9 lb

## 2023-10-12 DIAGNOSIS — E7800 Pure hypercholesterolemia, unspecified: Secondary | ICD-10-CM | POA: Diagnosis not present

## 2023-10-12 DIAGNOSIS — M25562 Pain in left knee: Secondary | ICD-10-CM

## 2023-10-12 DIAGNOSIS — E559 Vitamin D deficiency, unspecified: Secondary | ICD-10-CM

## 2023-10-12 DIAGNOSIS — I10 Essential (primary) hypertension: Secondary | ICD-10-CM | POA: Diagnosis not present

## 2023-10-12 DIAGNOSIS — K219 Gastro-esophageal reflux disease without esophagitis: Secondary | ICD-10-CM

## 2023-10-12 DIAGNOSIS — J301 Allergic rhinitis due to pollen: Secondary | ICD-10-CM

## 2023-10-12 DIAGNOSIS — R7309 Other abnormal glucose: Secondary | ICD-10-CM | POA: Diagnosis not present

## 2023-10-12 DIAGNOSIS — G8929 Other chronic pain: Secondary | ICD-10-CM

## 2023-10-12 DIAGNOSIS — M25561 Pain in right knee: Secondary | ICD-10-CM

## 2023-10-12 DIAGNOSIS — M25551 Pain in right hip: Secondary | ICD-10-CM

## 2023-10-12 MED ORDER — ROSUVASTATIN CALCIUM 20 MG PO TABS: 20.0000 mg | ORAL_TABLET | Freq: Every day | ORAL | 3 refills | Status: DC

## 2023-10-12 NOTE — Assessment & Plan Note (Signed)
Chronic,stable  Managed with Prevacid 20 mg daily.

## 2023-10-12 NOTE — Assessment & Plan Note (Signed)
Managed with vitamin D3 1000 units daily. Compliant with supplementation. Discussed checking vitamin D levels to ensure she is within the goal range.  Chronic - Order vitamin D level test today

## 2023-10-12 NOTE — Assessment & Plan Note (Signed)
Chronic  Improved on NSAIDS  Continue naproxen 500mg  twice daily PRN

## 2023-10-12 NOTE — Assessment & Plan Note (Signed)
Chronic  Managed with cetirizine 10 mg daily.

## 2023-10-12 NOTE — Assessment & Plan Note (Signed)
Chronic Blood pressure is well-controlled at 123/83 mmHg with bisoprolol 5 mg combined with hydrochlorothiazide 6.25 mg daily and aspirin 81 mg daily.

## 2023-10-12 NOTE — Assessment & Plan Note (Signed)
Chronic, stable  Managed with Naprosyn 500 mg twice daily as needed.

## 2023-10-12 NOTE — Progress Notes (Signed)
Established patient visit   Patient: Karen Henry   DOB: 1965/11/08   58 y.o. Female  MRN: [id number] Visit Date: 10/12/2023  Today's healthcare provider: [name], MD   Chief Complaint  Patient presents with   Medical Management of Chronic Issues    Follow-up HTN,HDL   Subjective     HPI     Medical Management of Chronic Issues    Additional comments: Follow-up HTN,HDL      Last edited by Marjie Skiff, CMA on 10/12/2023  8:14 AM.       Discussed the use of AI scribe software for clinical note transcription with the patient, who gave verbal consent to proceed.  History of Present Illness   Karen Henry is a 58 year old female with prediabetes who presents for a follow-up visit.  She is following up on her elevated hemoglobin A1c, which was last measured at 6.2% in August 2024, indicating prediabetes. She has been working on lifestyle modifications, including diet and exercise, although she notes that the holiday season may have impacted her ability to maintain these changes.  She has a history of hyperlipidemia and is currently taking Crestor 20 mg daily. There was a recent issue with her prescription indicating she should take half a tablet, but she has been taking the full 20 mg tablet daily. A lipid panel is planned to monitor her cholesterol levels.  For her hypertension, she is on a combination of bisoprolol 5 mg and hydrochlorothiazide 6.25 mg daily. Her blood pressure is stable.  She manages gastric reflux with Prevacid 20 mg daily.  She takes vitamin D3 1000 units daily for vitamin D deficiency. Her vitamin D levels were not checked at her last visit in August 2024, but she continues to take her supplement.  She experiences chronic pain in her bilateral knees and right hip, which is managed with Naprosyn 500 mg twice daily as needed.  She takes cetirizine 10 mg daily for seasonal allergic rhinitis.  She is up to date on her  shingles vaccine and will need a tetanus booster in November 2025. She received her last flu vaccine in 2023. She typically receives vaccines through her workplace, which may not always be documented in the [facility] record.         Past Medical History:  Diagnosis Date   Anxiety    GERD (gastroesophageal reflux disease)    High blood cholesterol level    Hypercoagulable state (HCC)    Hypertension    Rhinitis     Medications: Outpatient Medications Prior to Visit  Medication Sig   aspirin 81 MG tablet Take by mouth.   Bioflavonoid Products (VITAMIN C) CHEW Chew by mouth.   bisoprolol-hydrochlorothiazide (ZIAC) 5-6.25 MG tablet Take 1 tablet by mouth daily.   Cetirizine HCl 10 MG CAPS Take by mouth.   Cholecalciferol (VITAMIN D3) 25 MCG (1000 UT) CAPS Take 1,000 Units by mouth daily.   COENZYME Q-10 PO Take by mouth.   diphenhydrAMINE HCl (BENADRYL ALLERGY PO) Take by mouth at bedtime.   Naproxen DR 500 MG TBEC TAKE 1 TABLET(500 MG) BY MOUTH TWICE DAILY WITH A MEAL   omeprazole (PRILOSEC) 20 MG capsule TAKE 1 CAPSULE BY MOUTH EVERY DAY   triamcinolone (KENALOG) 0.1 % APPLY 1 APPLICATION TOPICALLY 2 (TWO) TIMES DAILY. APPLY 1 APPLICATION 2 TIMES DAILY TO FACIAL PATCHES OF DERMATITIS   [DISCONTINUED] rosuvastatin (CRESTOR) 20 MG tablet Take 1 tablet (20 mg total) by mouth  daily.   No facility-administered medications prior to visit.    Review of Systems  Last metabolic panel Lab Results  Component Value Date   GLUCOSE 107 (H) 01/03/2023   NA 140 01/03/2023   K 4.6 01/03/2023   CL 102 01/03/2023   CO2 23 01/03/2023   BUN 16 01/03/2023   CREATININE 0.76 01/03/2023   EGFR 92 01/03/2023   CALCIUM 9.7 01/03/2023   PROT 7.1 01/03/2023   ALBUMIN 4.4 01/03/2023   LABGLOB 2.7 01/03/2023   AGRATIO 1.6 01/03/2023   BILITOT 0.3 01/03/2023   ALKPHOS 102 01/03/2023   AST 21 01/03/2023   ALT 26 01/03/2023   Last lipids Lab Results  Component Value Date   CHOL 149  01/03/2023   HDL 40 01/03/2023   LDLCALC 83 01/03/2023   TRIG 147 01/03/2023   CHOLHDL 3.7 01/03/2023   Last hemoglobin A1c Lab Results  Component Value Date   HGBA1C 6.2 (A) 04/06/2023        Objective    BP 123/83 (BP Location: Right Arm, Patient Position: Sitting, Cuff Size: Normal)   Pulse 69   Resp 16   Ht 5\' 2"  (1.575 m)   Wt 167 lb 14.4 oz (76.2 kg)   SpO2 98%   BMI 30.71 kg/m  BP Readings from Last 3 Encounters:  10/12/23 123/83  04/06/23 115/65  01/03/23 117/79   Wt Readings from Last 3 Encounters:  10/12/23 167 lb 14.4 oz (76.2 kg)  04/06/23 167 lb (75.8 kg)  01/03/23 169 lb 3.2 oz (76.7 kg)        Physical Exam Vitals reviewed.  Constitutional:      General: She is not in acute distress.    Appearance: Normal appearance. She is not ill-appearing, toxic-appearing or diaphoretic.  Eyes:     Conjunctiva/sclera: Conjunctivae normal.  Cardiovascular:     Rate and Rhythm: Normal rate and regular rhythm.     Pulses: Normal pulses.     Heart sounds: Normal heart sounds. No murmur heard.    No friction rub. No gallop.  Pulmonary:     Effort: Pulmonary effort is normal. No respiratory distress.     Breath sounds: Normal breath sounds. No stridor. No wheezing, rhonchi or rales.  Abdominal:     General: Bowel sounds are normal. There is no distension.     Palpations: Abdomen is soft.     Tenderness: There is no abdominal tenderness.  Musculoskeletal:     Right lower leg: No edema.     Left lower leg: No edema.  Skin:    Findings: No erythema or rash.  Neurological:     Mental Status: She is alert and oriented to person, place, and time.  Psychiatric:        Mood and Affect: Mood and affect normal.        Speech: Speech normal.        Behavior: Behavior normal. Behavior is cooperative.       No results found for any visits on 10/12/23.  Assessment & Plan     Problem List Items Addressed This Visit       Cardiovascular and Mediastinum    Essential (primary) hypertension - Primary   Chronic Blood pressure is well-controlled at 123/83 mmHg with bisoprolol 5 mg combined with hydrochlorothiazide 6.25 mg daily and aspirin 81 mg daily.        Relevant Medications   rosuvastatin (CRESTOR) 20 MG tablet   Other Relevant Orders   Comprehensive metabolic panel  Respiratory   Allergic rhinitis   Chronic  Managed with cetirizine 10 mg daily.        Other   Vitamin D deficiency   Managed with vitamin D3 1000 units daily. Compliant with supplementation. Discussed checking vitamin D levels to ensure she is within the goal range.  Chronic - Order vitamin D level test today        Relevant Orders   VITAMIN D 25 Hydroxy (Vit-D Deficiency, Fractures)   Hypercholesteremia   Managed with Crestor 20 mg daily. Compliant with medication. Discussed correcting the mail order prescription for proper dosing.   Chronic - Order lipid panel today   - Send new prescription for Crestor 20 mg daily to mail order pharmacy        Relevant Medications   rosuvastatin (CRESTOR) 20 MG tablet   Other Relevant Orders   Lipid panel   Elevated hemoglobin A1c   Hemoglobin A1c was 6.2% in August 2024, indicating prediabetes. She acknowledges the need for improvement, especially after the holiday season. Decided to proceed with the A1c test today.   Chronic - Order Hemoglobin A1c test today   - Encourage continued lifestyle modifications, including diet and exercise        Relevant Orders   Hemoglobin A1c   Chronic pain of both knees   Chronic, stable  Managed with Naprosyn 500 mg twice daily as needed.      Chronic hip pain, right   Chronic  Improved on NSAIDS  Continue naproxen 500mg  twice daily PRN             General Health Maintenance   Up-to-date on Pap smear and mammogram. Last flu vaccine in 2023, updated on shingles vaccine. Tetanus vaccine due in November 2025.     No follow-ups on file.         [name], MD  [facility] [phone] (phone) [phone] (fax)  [facility]

## 2023-10-12 NOTE — Assessment & Plan Note (Signed)
Managed with Crestor 20 mg daily. Compliant with medication. Discussed correcting the mail order prescription for proper dosing.   Chronic - Order lipid panel today   - Send new prescription for Crestor 20 mg daily to mail order pharmacy

## 2023-10-12 NOTE — Assessment & Plan Note (Signed)
Hemoglobin A1c was 6.2% in August 2024, indicating prediabetes. She acknowledges the need for improvement, especially after the holiday season. Decided to proceed with the A1c test today.   Chronic - Order Hemoglobin A1c test today   - Encourage continued lifestyle modifications, including diet and exercise

## 2023-10-13 ENCOUNTER — Encounter: Payer: Self-pay | Admitting: Family Medicine

## 2023-10-13 LAB — COMPREHENSIVE METABOLIC PANEL
ALT: 30 [IU]/L (ref 0–32)
AST: 25 [IU]/L (ref 0–40)
Albumin: 4.6 g/dL (ref 3.8–4.9)
Alkaline Phosphatase: 111 [IU]/L (ref 44–121)
BUN/Creatinine Ratio: 15 (ref 9–23)
BUN: 12 mg/dL (ref 6–24)
Bilirubin Total: 0.3 mg/dL (ref 0.0–1.2)
CO2: 24 mmol/L (ref 20–29)
Calcium: 10.1 mg/dL (ref 8.7–10.2)
Chloride: 100 mmol/L (ref 96–106)
Creatinine, Ser: 0.79 mg/dL (ref 0.57–1.00)
Globulin, Total: 2.8 g/dL (ref 1.5–4.5)
Glucose: 120 mg/dL — ABNORMAL HIGH (ref 70–99)
Potassium: 4.5 mmol/L (ref 3.5–5.2)
Sodium: 141 mmol/L (ref 134–144)
Total Protein: 7.4 g/dL (ref 6.0–8.5)
eGFR: 87 mL/min/{1.73_m2} (ref >59)

## 2023-10-13 LAB — LIPID PANEL
Chol/HDL Ratio: 3.9 {ratio} (ref 0.0–4.4)
Cholesterol, Total: 157 mg/dL (ref 100–199)
HDL: 40 mg/dL (ref >39)
LDL Chol Calc (NIH): 85 mg/dL (ref 0–99)
Triglycerides: 186 mg/dL — ABNORMAL HIGH (ref 0–149)
VLDL Cholesterol Cal: 32 mg/dL (ref 5–40)

## 2023-10-13 LAB — HEMOGLOBIN A1C
Est. average glucose Bld gHb Est-mCnc: 134 mg/dL
Hgb A1c MFr Bld: 6.3 % — ABNORMAL HIGH (ref 4.8–5.6)

## 2023-10-13 LAB — VITAMIN D 25 HYDROXY (VIT D DEFICIENCY, FRACTURES): Vit D, 25-Hydroxy: 52.2 ng/mL (ref 30.0–100.0)

## 2023-10-25 NOTE — Assessment & Plan Note (Signed)
 Chronic conditions are stable  Patient was counseled on benefits of regular physical activity with goal of 150 minutes of moderate to vigurous intensity 4 days per week  Patient was counseled to consume well balanced diet of fruits, vegetables, limited saturated fats and limited sugary foods and beverages with emphasis on consuming 6-8 glasses of water daily  Screening recommended today:none labs completed during prior appt in Feb 2025  Colon cancer screening: colonoscopy in 2018    Cervical CA screening: will complete with OB/GYN    Mammogram: normal 12/2022, will complete for annual exam w/ GYN     Lung CA screening CT: N/A    Vaccines recommended today: influenza, end of season

## 2023-10-25 NOTE — Progress Notes (Unsigned)
 Complete physical exam   Patient: Karen Henry   DOB: 06/26/1966   58 y.o. Female  MRN: [id number] Visit Date: 10/26/2023  Today's healthcare provider: [name], MD   No chief complaint on file.  Subjective    Karen Henry is a 58 y.o. female who presents today for a complete physical exam.   She reports consuming a {diet types:17450} diet.   {Exercise:19826}    She {does/does not:200015} have additional problems to discuss today.   Discussed the use of AI scribe software for clinical note transcription with the patient, who gave verbal consent to proceed.  History of Present Illness             Past Medical History:  Diagnosis Date   Anxiety    GERD (gastroesophageal reflux disease)    High blood cholesterol level    Hypercoagulable state (HCC)    Hypertension    Rhinitis    Past Surgical History:  Procedure Laterality Date   COLONOSCOPY WITH PROPOFOL N/A 03/24/2017   Procedure: COLONOSCOPY WITH PROPOFOL;  Surgeon: Scot Jun, MD;  Location: [facility]COPY;  Service: Endoscopy;  Laterality: N/A;   TUBAL LIGATION     Social History   Socioeconomic History   Marital status: Married    Spouse name: Not on file   Number of children: Not on file   Years of education: Not on file   Highest education level: Not on file  Occupational History   Not on file  Tobacco Use   Smoking status: Never   Smokeless tobacco: Never  Vaping Use   Vaping status: Never Used  Substance and Sexual Activity   Alcohol use: Yes    Comment: social   Drug use: No   Sexual activity: Not on file  Other Topics Concern   Not on file  Social History Narrative   Not on file   Social Drivers of Health   Financial Resource Strain: Not on file  Food Insecurity: Not on file  Transportation Needs: Not on file  Physical Activity: Not on file  Stress: Not on file  Social Connections: Not on file  Intimate Partner Violence: Not on file   Family  Status  Relation Name Status   Mother  Alive   Father  Alive   Sister  Alive   Brother  Alive   Son  Alive   Neg Hx  (Not Specified)  No partnership data on file   Family History  Problem Relation Age of Onset   Hypertension Mother    Hypertension Father    Heart disease Father    Healthy Sister    Diabetes Brother    ADD / ADHD Son    Breast cancer Neg Hx    Colon cancer Neg Hx    Ovarian cancer Neg Hx    Allergies  Allergen Reactions   Egg-Derived Products Itching   Penicillins Rash     Medications: Outpatient Medications Prior to Visit  Medication Sig   aspirin 81 MG tablet Take by mouth.   Bioflavonoid Products (VITAMIN C) CHEW Chew by mouth.   bisoprolol-hydrochlorothiazide (ZIAC) 5-6.25 MG tablet Take 1 tablet by mouth daily.   Cetirizine HCl 10 MG CAPS Take by mouth.   Cholecalciferol (VITAMIN D3) 25 MCG (1000 UT) CAPS Take 1,000 Units by mouth daily.   COENZYME Q-10 PO Take by mouth.   diphenhydrAMINE HCl (BENADRYL ALLERGY PO) Take by mouth at bedtime.   Naproxen DR 500 MG  TBEC TAKE 1 TABLET(500 MG) BY MOUTH TWICE DAILY WITH A MEAL   omeprazole (PRILOSEC) 20 MG capsule TAKE 1 CAPSULE BY MOUTH EVERY DAY   rosuvastatin (CRESTOR) 20 MG tablet Take 1 tablet (20 mg total) by mouth daily.   triamcinolone (KENALOG) 0.1 % APPLY 1 APPLICATION TOPICALLY 2 (TWO) TIMES DAILY. APPLY 1 APPLICATION 2 TIMES DAILY TO FACIAL PATCHES OF DERMATITIS   No facility-administered medications prior to visit.    Review of Systems  Last CBC Lab Results  Component Value Date   WBC 7.2 10/07/2021   HGB 11.9 10/07/2021   HCT 35.3 10/07/2021   MCV 81 10/07/2021   MCH 27.4 10/07/2021   RDW 13.1 10/07/2021   PLT 308 10/07/2021   Last metabolic panel Lab Results  Component Value Date   GLUCOSE 120 (H) 10/12/2023   NA 141 10/12/2023   K 4.5 10/12/2023   CL 100 10/12/2023   CO2 24 10/12/2023   BUN 12 10/12/2023   CREATININE 0.79 10/12/2023   EGFR 87 10/12/2023   CALCIUM 10.1  10/12/2023   PROT 7.4 10/12/2023   ALBUMIN 4.6 10/12/2023   LABGLOB 2.8 10/12/2023   AGRATIO 1.6 01/03/2023   BILITOT 0.3 10/12/2023   ALKPHOS 111 10/12/2023   AST 25 10/12/2023   ALT 30 10/12/2023   Last lipids Lab Results  Component Value Date   CHOL 157 10/12/2023   HDL 40 10/12/2023   LDLCALC 85 10/12/2023   TRIG 186 (H) 10/12/2023   CHOLHDL 3.9 10/12/2023   Last hemoglobin A1c Lab Results  Component Value Date   HGBA1C 6.3 (H) 10/12/2023   Last thyroid functions Lab Results  Component Value Date   TSH 1.520 10/07/2021   Last vitamin D Lab Results  Component Value Date   VD25OH 52.2 10/12/2023   Last vitamin B12 and Folate No results found for: "VITAMINB12", "FOLATE"   {See past labs  Heme  Chem  Endocrine  Serology  Results Review (optional):1}  Objective    There were no vitals taken for this visit. BP Readings from Last 3 Encounters:  10/12/23 123/83  04/06/23 115/65  01/03/23 117/79   Wt Readings from Last 3 Encounters:  10/12/23 167 lb 14.4 oz (76.2 kg)  04/06/23 167 lb (75.8 kg)  01/03/23 169 lb 3.2 oz (76.7 kg)    {See vitals history (optional):1}    Physical Exam  ***  Last depression screening scores    10/12/2023    8:18 AM 04/06/2023    8:16 AM 10/11/2022    9:52 AM  PHQ 2/9 Scores  PHQ - 2 Score 0 0 0  PHQ- 9 Score  0     Last fall risk screening    10/12/2023    8:17 AM  Fall Risk   Falls in the past year? 0  Number falls in past yr: 0  Injury with Fall? 0  Risk for fall due to : No Fall Risks    Last Audit-C alcohol use screening    04/06/2023    8:17 AM  Alcohol Use Disorder Test (AUDIT)  1. How often do you have a drink containing alcohol? 1  2. How many drinks containing alcohol do you have on a typical day when you are drinking? 0  3. How often do you have six or more drinks on one occasion? 0  AUDIT-C Score 1   A score of 3 or more in women, and 4 or more in men indicates increased risk for alcohol abuse,  EXCEPT if all of the points are from question 1   No results found for any visits on 10/26/23.  Assessment & Plan    Routine Health Maintenance and Physical Exam  Immunization History  Administered Date(s) Administered   Influenza, Quadrivalent, Recombinant, Inj, Pf 07/14/2018   Influenza,inj,Quad PF,6+ Mos 06/30/2015   Influenza-Unspecified 06/12/2020   PFIZER(Purple Top)SARS-COV-2 Vaccination 12/14/2019, 01/18/2020   Tdap 06/30/2014   Zoster Recombinant(Shingrix) 10/11/2022, 01/03/2023    Health Maintenance  Topic Date Due   Cervical Cancer Screening (HPV/Pap Cotest)  08/25/2020   INFLUENZA VACCINE  03/30/2023   COVID-19 Vaccine (3 - 2024-25 season) 10/28/2023 (Originally 04/30/2023)   DTaP/Tdap/Td (2 - Td or Tdap) 06/30/2024   MAMMOGRAM  01/08/2025   Colonoscopy  03/25/2027   Hepatitis C Screening  Completed   HIV Screening  Completed   Zoster Vaccines- Shingrix  Completed   HPV VACCINES  Aged Out    Problem List Items Addressed This Visit   None                   No follow-ups on file.       [name], MD  [facility] [phone] (phone) [phone] (fax)  [facility]

## 2023-10-26 ENCOUNTER — Encounter: Payer: Self-pay | Admitting: Family Medicine

## 2023-10-26 ENCOUNTER — Ambulatory Visit: Payer: Self-pay | Admitting: Family Medicine

## 2023-10-26 VITALS — BP 113/73 | HR 65 | Ht 62.0 in | Wt 167.0 lb

## 2023-10-26 DIAGNOSIS — Z0001 Encounter for general adult medical examination with abnormal findings: Secondary | ICD-10-CM

## 2023-10-26 DIAGNOSIS — K219 Gastro-esophageal reflux disease without esophagitis: Secondary | ICD-10-CM

## 2023-10-26 DIAGNOSIS — E7800 Pure hypercholesterolemia, unspecified: Secondary | ICD-10-CM | POA: Diagnosis not present

## 2023-10-26 DIAGNOSIS — E559 Vitamin D deficiency, unspecified: Secondary | ICD-10-CM

## 2023-10-26 DIAGNOSIS — Z6830 Body mass index (BMI) 30.0-30.9, adult: Secondary | ICD-10-CM

## 2023-10-26 DIAGNOSIS — I10 Essential (primary) hypertension: Secondary | ICD-10-CM

## 2023-10-26 DIAGNOSIS — M25562 Pain in left knee: Secondary | ICD-10-CM

## 2023-10-26 DIAGNOSIS — M25551 Pain in right hip: Secondary | ICD-10-CM

## 2023-10-26 DIAGNOSIS — M25561 Pain in right knee: Secondary | ICD-10-CM

## 2023-10-26 DIAGNOSIS — G8929 Other chronic pain: Secondary | ICD-10-CM

## 2023-10-26 DIAGNOSIS — Z0000 Encounter for general adult medical examination without abnormal findings: Secondary | ICD-10-CM

## 2023-10-26 MED ORDER — NAPROXEN 500 MG PO TABS: 500.0000 mg | ORAL_TABLET | Freq: Two times a day (BID) | ORAL | 3 refills | Status: DC

## 2023-10-26 NOTE — Assessment & Plan Note (Signed)
 Triglycerides are elevated at 186 mg/dL, with a goal of less than 150 mg/dL. Likely driven by carbohydrate intake. Discussed dietary modifications to reduce carbohydrate intake and the role of exercise in managing triglyceride levels. Chronic  - Advise reducing intake of sugars, breads, rice, and pasta. - Encourage regular exercise to help lower triglycerides. On Crestor (rosuvastatin) for cholesterol management. Current lipid panel shows LDL 85 mg/dL and total cholesterol 784 mg/dL, indicating good control. Discussed the role of diet and exercise in maintaining lipid levels. Allergic to dairy and nuts, limiting dietary options. - Continue Crestor (rosuvastatin) as prescribed. - Encourage a diet low in saturated fats and high in fish.

## 2023-10-26 NOTE — Assessment & Plan Note (Signed)
 Uses naproxen for pain management, particularly on days with increased physical activity. Insurance covers naproxen 500 mg tablets but not the delayed-release form. Discussed the difference between regular and delayed-release formulations and potential gastrointestinal upset with regular naproxen. Prefers to take naproxen only when necessary. - Switch prescription to naproxen 500 mg tablets. - Advise taking naproxen as needed, particularly on days with increased physical activity.

## 2023-11-28 ENCOUNTER — Other Ambulatory Visit: Payer: Self-pay | Admitting: Family Medicine

## 2023-11-28 DIAGNOSIS — I10 Essential (primary) hypertension: Secondary | ICD-10-CM

## 2023-11-29 ENCOUNTER — Other Ambulatory Visit: Payer: Self-pay | Admitting: Obstetrics and Gynecology

## 2023-11-29 DIAGNOSIS — Z124 Encounter for screening for malignant neoplasm of cervix: Secondary | ICD-10-CM | POA: Diagnosis not present

## 2023-11-29 DIAGNOSIS — Z1231 Encounter for screening mammogram for malignant neoplasm of breast: Secondary | ICD-10-CM

## 2023-11-29 DIAGNOSIS — Z01411 Encounter for gynecological examination (general) (routine) with abnormal findings: Secondary | ICD-10-CM | POA: Diagnosis not present

## 2024-01-04 ENCOUNTER — Encounter (HOSPITAL_COMMUNITY): Payer: Self-pay

## 2024-01-08 ENCOUNTER — Encounter (HOSPITAL_COMMUNITY): Payer: Self-pay

## 2024-01-11 ENCOUNTER — Encounter

## 2024-01-15 ENCOUNTER — Ambulatory Visit
Admission: RE | Admit: 2024-01-15 | Discharge: 2024-01-15 | Disposition: A | Discharge status: Home / Self Care | Source: Ambulatory Visit | Attending: Obstetrics and Gynecology | Admitting: Obstetrics and Gynecology

## 2024-01-15 DIAGNOSIS — Z1231 Encounter for screening mammogram for malignant neoplasm of breast: Secondary | ICD-10-CM | POA: Diagnosis not present

## 2024-04-24 ENCOUNTER — Encounter: Payer: Self-pay | Admitting: Family Medicine

## 2024-04-24 ENCOUNTER — Ambulatory Visit: Payer: BC Managed Care – PPO | Admitting: Family Medicine

## 2024-04-24 ENCOUNTER — Other Ambulatory Visit: Payer: Self-pay | Admitting: Family Medicine

## 2024-04-24 VITALS — BP 122/88 | HR 66 | Resp 16 | Ht 62.0 in | Wt 166.5 lb

## 2024-04-24 DIAGNOSIS — I10 Essential (primary) hypertension: Secondary | ICD-10-CM

## 2024-04-24 DIAGNOSIS — J301 Allergic rhinitis due to pollen: Secondary | ICD-10-CM

## 2024-04-24 DIAGNOSIS — E559 Vitamin D deficiency, unspecified: Secondary | ICD-10-CM

## 2024-04-24 DIAGNOSIS — M25562 Pain in left knee: Secondary | ICD-10-CM

## 2024-04-24 DIAGNOSIS — E7800 Pure hypercholesterolemia, unspecified: Secondary | ICD-10-CM

## 2024-04-24 DIAGNOSIS — K219 Gastro-esophageal reflux disease without esophagitis: Secondary | ICD-10-CM | POA: Diagnosis not present

## 2024-04-24 DIAGNOSIS — Z6830 Body mass index (BMI) 30.0-30.9, adult: Secondary | ICD-10-CM

## 2024-04-24 DIAGNOSIS — M25551 Pain in right hip: Secondary | ICD-10-CM

## 2024-04-24 DIAGNOSIS — R7303 Prediabetes: Secondary | ICD-10-CM

## 2024-04-24 DIAGNOSIS — D6859 Other primary thrombophilia: Secondary | ICD-10-CM

## 2024-04-24 DIAGNOSIS — F419 Anxiety disorder, unspecified: Secondary | ICD-10-CM

## 2024-04-24 DIAGNOSIS — G8929 Other chronic pain: Secondary | ICD-10-CM

## 2024-04-24 DIAGNOSIS — M25561 Pain in right knee: Secondary | ICD-10-CM

## 2024-04-24 MED ORDER — OMEPRAZOLE 20 MG PO CPDR: DELAYED_RELEASE_CAPSULE | ORAL | 3 refills | Status: DC

## 2024-04-24 MED ORDER — ROSUVASTATIN CALCIUM 20 MG PO TABS
20.0000 mg | ORAL_TABLET | Freq: Every day | ORAL | 3 refills | Status: DC
Start: 2024-04-24 — End: 2024-05-30

## 2024-04-24 MED ORDER — BISOPROLOL-HYDROCHLOROTHIAZIDE 5-6.25 MG PO TABS: 1.0000 | ORAL_TABLET | Freq: Every day | ORAL | 3 refills | Status: DC

## 2024-04-24 NOTE — Assessment & Plan Note (Addendum)
 Chronic Blood pressure is well-controlled -continue bisoprolol  5 mg combined with hydrochlorothiazide  6.25 mg daily

## 2024-04-24 NOTE — Progress Notes (Signed)
 Established patient visit   Patient: Karen Henry   DOB: 17-Apr-1966   58 y.o. Female  MRN: [id number] Visit Date: 04/24/2024  Today's healthcare provider: Rockie Agent, MD   Chief Complaint  Patient presents with   Medical Management of Chronic Issues    HTN    Subjective     HPI     Medical Management of Chronic Issues    Additional comments: HTN       Last edited by Rosas, Joseline E, CMA on 04/24/2024  8:24 AM.       Discussed the use of AI scribe software for clinical note transcription with the patient, who gave verbal consent to proceed.  History of Present Illness Karen Henry is a 58 year old female who presents for a chronic follow-up visit.  She plans to receive her annual influenza vaccine through her employer and requests [facility] rather than a mail order [facility]. She has no other concerns at this time.  She has hypertension and takes bisoprolol  and hydrochlorothiazide  5 mg/6.25 mg daily. She also takes an 81 mg aspirin daily due to a family history of heart disease and a personal history of factor V Leiden mutation.  She has hyperlipidemia and takes Crestor  20 mg daily. Her last cholesterol check in February showed triglycerides at 186 mg/dL and LDL at 85 mg/dL. She is interested in rechecking her cholesterol levels.  She has GERD and takes omeprazole  20 mg daily. She can feel when her reflux is acting up.  She experiences chronic knee and right hip pain, for which she takes naproxen  500 mg twice daily.  She reports allergies and sinus issues, particularly related to dairy products, causing her to wake up unable to breathe well. She takes cetirizine 10 mg for allergies and uses triamcinolone  0.1% cream as needed. She also takes a generic Benadryl at night to prevent anxiety attacks triggered by her inability to breathe well due to allergies.  She takes vitamin D3 1000 IU daily, a vitamin C chewable, and a  gummy omega-3 supplement.  She experiences stress related to life in general and caring for her mother, but she feels she can manage it without medication. She uses self-feedback and coping mechanisms to handle anxiety.  She is scheduled for a colonoscopy discussion due to a previous positive test, although she questions whether constipation at the time may have affected the results. She has no known family history of colon cancer.  She has lost one pound and attributes her difficulty in losing more weight to lack of time for physical activity due to responsibilities, including caring for her mother.     Past Medical History:  Diagnosis Date   Anxiety    GERD (gastroesophageal reflux disease)    High blood cholesterol level    Hypercoagulable state (HCC)    Hypertension    Rhinitis     Medications: Outpatient Medications Prior to Visit  Medication Sig   aspirin 81 MG tablet Take by mouth.   Bioflavonoid Products (VITAMIN C) CHEW Chew by mouth.   Cetirizine HCl 10 MG CAPS Take by mouth.   Cholecalciferol (VITAMIN D3) 25 MCG (1000 UT) CAPS Take 1,000 Units by mouth daily.   COENZYME Q-10 PO Take by mouth.   diphenhydrAMINE HCl (BENADRYL ALLERGY PO) Take by mouth at bedtime.   naproxen  (NAPROSYN ) 500 MG tablet Take 1 tablet (500 mg total) by mouth 2 (two) times daily with a meal.  Omega Fatty Acids-Vitamins (OMEGA-3 GUMMIES) CHEW Chew 1 tablet by mouth at bedtime.   triamcinolone  (KENALOG ) 0.1 % APPLY 1 APPLICATION TOPICALLY 2 (TWO) TIMES DAILY. APPLY 1 APPLICATION 2 TIMES DAILY TO FACIAL PATCHES OF DERMATITIS   [DISCONTINUED] bisoprolol -hydrochlorothiazide  (ZIAC ) 5-6.25 MG tablet TAKE 1 TABLET BY MOUTH DAILY   [DISCONTINUED] omeprazole  (PRILOSEC) 20 MG capsule TAKE 1 CAPSULE BY MOUTH EVERY DAY   [DISCONTINUED] rosuvastatin  (CRESTOR ) 20 MG tablet Take 1 tablet (20 mg total) by mouth daily.   No facility-administered medications prior to visit.    Review of Systems  Last  CBC Lab Results  Component Value Date   WBC 7.2 10/07/2021   HGB 11.9 10/07/2021   HCT 35.3 10/07/2021   MCV 81 10/07/2021   MCH 27.4 10/07/2021   RDW 13.1 10/07/2021   PLT 308 10/07/2021   Last metabolic panel Lab Results  Component Value Date   GLUCOSE 120 (H) 10/12/2023   NA 141 10/12/2023   K 4.5 10/12/2023   CL 100 10/12/2023   CO2 24 10/12/2023   BUN 12 10/12/2023   CREATININE 0.79 10/12/2023   EGFR 87 10/12/2023   CALCIUM  10.1 10/12/2023   PROT 7.4 10/12/2023   ALBUMIN 4.6 10/12/2023   LABGLOB 2.8 10/12/2023   AGRATIO 1.6 01/03/2023   BILITOT 0.3 10/12/2023   ALKPHOS 111 10/12/2023   AST 25 10/12/2023   ALT 30 10/12/2023   Last lipids Lab Results  Component Value Date   CHOL 157 10/12/2023   HDL 40 10/12/2023   LDLCALC 85 10/12/2023   TRIG 186 (H) 10/12/2023   CHOLHDL 3.9 10/12/2023   Last hemoglobin A1c Lab Results  Component Value Date   HGBA1C 6.3 (H) 10/12/2023   Last thyroid functions Lab Results  Component Value Date   TSH 1.520 10/07/2021   Last vitamin D  Lab Results  Component Value Date   VD25OH 52.2 10/12/2023         Objective    BP 122/88 (BP Location: Right Arm, Patient Position: Sitting, Cuff Size: Normal)   Pulse 66   Resp 16   Ht 5' 2 (1.575 m)   Wt 166 lb 8 oz (75.5 kg)   SpO2 98%   BMI 30.45 kg/m   BP Readings from Last 3 Encounters:  04/24/24 122/88  10/26/23 113/73  10/12/23 123/83   Wt Readings from Last 3 Encounters:  04/24/24 166 lb 8 oz (75.5 kg)  10/26/23 167 lb (75.8 kg)  10/12/23 167 lb 14.4 oz (76.2 kg)        Physical Exam Vitals reviewed.  Constitutional:      General: She is not in acute distress.    Appearance: Normal appearance. She is not ill-appearing.  Cardiovascular:     Rate and Rhythm: Normal rate and regular rhythm.  Pulmonary:     Effort: Pulmonary effort is normal. No respiratory distress.     Breath sounds: No wheezing, rhonchi or rales.  Neurological:     Mental Status:  She is alert and oriented to person, place, and time.  Psychiatric:        Mood and Affect: Mood normal.        Behavior: Behavior normal.       No results found for any visits on 04/24/24.  Assessment & Plan     Problem List Items Addressed This Visit     Acid reflux   Chronic,stable  Managed with Prevacid 20 mg daily.    Continue prevacid 20mg  daily  Relevant Medications   omeprazole  (PRILOSEC) 20 MG capsule   Allergic rhinitis   Chronic nasal congestion  Continue cetirizine 10mg  daily  Takes benadryl at night to help with sleep/congestion      Anxiety   Chronic  Symptoms well controlled without medications  She uses coping skills to manage triggers  Prefers to manage without medications for now      BMI 30.0-30.9,adult   BMI 30.45 She has lost one pound. Discussed challenges with time management and stress affecting exercise. - Encourage incorporating short walks or other physical activities into daily routine      Chronic hip pain, right   Chronic  Improved on NSAIDS  Continue naproxen  500mg  twice daily PRN        Chronic pain of both knees   Chronic condition  Uses naproxen  for pain management - continue naproxen  500 mg tablets. - Advise taking naproxen  as needed, particularly on days with increased physical activity.      Essential (primary) hypertension - Primary   Chronic Blood pressure is well-controlled -continue bisoprolol  5 mg combined with hydrochlorothiazide  6.25 mg daily       Relevant Medications   bisoprolol -hydrochlorothiazide  (ZIAC ) 5-6.25 MG tablet   rosuvastatin  (CRESTOR ) 20 MG tablet   RESOLVED: Gastroesophageal reflux disease   Relevant Medications   omeprazole  (PRILOSEC) 20 MG capsule   Hyperlipidemia   Chronic condition  Last LDL within goal range of less than 100  TG elevated at 186, goal less than 150  Continue crestor  20mg  daily  repeat lipid panel today       Relevant Medications    bisoprolol -hydrochlorothiazide  (ZIAC ) 5-6.25 MG tablet   rosuvastatin  (CRESTOR ) 20 MG tablet   Primary hypercoagulable state (HCC)   Chronic condition  + heme occult testing with GYN  She has follow up with GI for colonoscopy in October Continue ASA 81mg  daily       Vitamin D  deficiency   Chronic  Last level was within goal range in Feb 2025, 52 Continue supplement daily       Other Visit Diagnoses       Prediabetes       Relevant Orders   Hemoglobin A1c        Assessment & Plan    Prediabetes Hemoglobin A1c is 6.3%, indicating prediabetes. Discussed lifestyle factors such as stress and diet influencing glucose levels. - Order hemoglobin A1c test   Chronic right hip and knee pain Chronic pain managed with naproxen  500 mg twice daily. - Continue naproxen  500 mg twice daily - [facility]    General Health Maintenance Plans to get annual influenza vaccine through employer. Colonoscopy recommended for colon cancer screening. Discussed colonoscopy's direct visualization and biopsy capability with a 10-year interval if normal versus Cologuard's 92% accuracy but potential to miss 8% of cases. - Proceed with colonoscopy for colon cancer screening - Annual influenza vaccine to be obtained through employer     Return in about 6 months (around 10/28/2024) for CPE.         Rockie Agent, MD  [facility] [phone] (phone) [phone] (fax)  [facility]

## 2024-04-24 NOTE — Assessment & Plan Note (Signed)
 BMI 30.45 She has lost one pound. Discussed challenges with time management and stress affecting exercise. - Encourage incorporating short walks or other physical activities into daily routine

## 2024-04-24 NOTE — Assessment & Plan Note (Signed)
 Chronic  Improved on NSAIDS  Continue naproxen 500mg  twice daily PRN

## 2024-04-24 NOTE — Assessment & Plan Note (Addendum)
 Chronic condition  Last LDL within goal range of less than 100  TG elevated at 186, goal less than 150  Continue crestor  20mg  daily  repeat lipid panel today

## 2024-04-24 NOTE — Patient Instructions (Addendum)
      VISIT SUMMARY: Today, we reviewed your ongoing health conditions and made plans for your medication refills, upcoming tests, and preventive care. You are managing your conditions well, and we discussed ways to incorporate more physical activity into your routine.  YOUR PLAN: HYPERTENSION: Your blood pressure is well-controlled with your current medication. -Continue taking bisoprolol  and hydrochlorothiazide  5 mg/6.25 mg daily. -[facility]  HYPERLIPIDEMIA: Your cholesterol levels need to be rechecked. -A lipid panel will be ordered to recheck your cholesterol levels.  PREDIABETES: Your hemoglobin A1c is 6.3%, indicating prediabetes. -A hemoglobin A1c test will be ordered to monitor your glucose levels.  GASTROESOPHAGEAL REFLUX DISEASE (GERD): Your GERD symptoms are managed with omeprazole . -Continue taking omeprazole  20 mg daily. -[facility].  CHRONIC RIGHT HIP AND KNEE PAIN: You manage your chronic pain with naproxen . -Continue taking naproxen  500 mg twice daily. -[facility].  ALLERGIC RHINITIS: You manage your allergy symptoms with cetirizine and Benadryl as needed. -Continue taking cetirizine 10 mg daily. -Use Benadryl as needed for allergic reactions.  OBESITY: You have lost one pound and face challenges with time management and stress affecting exercise. -Try to incorporate short walks or other physical activities into your daily routine.  GENERAL HEALTH MAINTENANCE: You plan to get your annual influenza vaccine and are scheduled for a colonoscopy. -Proceed with the colonoscopy for colon cancer screening. -Get your annual influenza vaccine through your employer.                      Contains text generated by [employer]bridge.                                 Contains text generated by Abridge.                      Contains  text generated by Abridge.                                 Contains text generated by Abridge.

## 2024-04-24 NOTE — Assessment & Plan Note (Signed)
 Chronic condition  + heme occult testing with GYN  She has follow up with GI for colonoscopy in October Continue ASA 81mg  daily

## 2024-04-24 NOTE — Assessment & Plan Note (Addendum)
 Chronic  Symptoms well controlled without medications  She uses coping skills to manage triggers  Prefers to manage without medications for now

## 2024-04-24 NOTE — Assessment & Plan Note (Signed)
 Chronic,stable  Managed with Prevacid 20 mg daily.    Continue prevacid 20mg  daily

## 2024-04-24 NOTE — Telephone Encounter (Unsigned)
 Copied from CRM #[id number]. Topic: Clinical - Medication Refill >> Apr 24, 2024 12:48 PM Delon T wrote: Medication: naproxen  (NAPROSYN ) 500 MG tablet  Has the patient contacted their pharmacy? Yes (Agent: If no, request that the patient contact the pharmacy for the refill. If patient does not wish to contact the pharmacy document the reason why and proceed with request.) (Agent: If yes, when and what did the pharmacy advise?)  This is the patient's preferred pharmacy:  [id number] [place], [place] - [address] [place] [address] Phone: [phone] Fax: [phone]    Is this the correct pharmacy for this prescription? Yes If no, delete pharmacy and type the correct one.   Has the prescription been filled recently? Yes  Is the patient out of the medication? Yes  Has the patient been seen for an appointment in the last year OR does the patient have an upcoming appointment? Yes  Can we respond through MyChart? Yes  Agent: Please be advised that Rx refills may take up to 3 business days. We ask that you follow-up with your pharmacy.

## 2024-04-24 NOTE — Assessment & Plan Note (Signed)
 Chronic  Last level was within goal range in Feb 2025, 52 Continue supplement daily

## 2024-04-24 NOTE — Assessment & Plan Note (Signed)
 Chronic nasal congestion  Continue cetirizine 10mg  daily  Takes benadryl at night to help with sleep/congestion

## 2024-04-24 NOTE — Assessment & Plan Note (Signed)
 Chronic condition  Uses naproxen  for pain management - continue naproxen  500 mg tablets. - Advise taking naproxen  as needed, particularly on days with increased physical activity.

## 2024-04-25 LAB — LIPID PANEL
Chol/HDL Ratio: 4.5 ratio — ABNORMAL HIGH (ref 0.0–4.4)
Cholesterol, Total: 168 mg/dL (ref 100–199)
HDL: 37 mg/dL — ABNORMAL LOW (ref >39)
LDL Chol Calc (NIH): 95 mg/dL (ref 0–99)
Triglycerides: 212 mg/dL — ABNORMAL HIGH (ref 0–149)
VLDL Cholesterol Cal: 36 mg/dL (ref 5–40)

## 2024-04-25 LAB — HEMOGLOBIN A1C
Est. average glucose Bld gHb Est-mCnc: 140 mg/dL
Hgb A1c MFr Bld: 6.5 % — ABNORMAL HIGH (ref 4.8–5.6)

## 2024-04-25 NOTE — Telephone Encounter (Signed)
 Requested medications are due for refill today.  No - but pt wants rx to go to a different pharmacy  Requested medications are on the active medications list.  yes  Last refill. 10/26/2023 #120 3 rf  Future visit scheduled.   Yes - in 6 months  Notes to clinic.  Labs are expired.    Requested Prescriptions  Pending Prescriptions Disp Refills   naproxen  (NAPROSYN ) 500 MG tablet 120 tablet 3    Sig: Take 1 tablet (500 mg total) by mouth 2 (two) times daily with a meal.     Analgesics:  NSAIDS Failed - 04/25/2024  5:56 PM      Failed - Manual Review: Labs are only required if the patient has taken medication for more than 8 weeks.      Failed - HGB in normal range and within 360 days    Hemoglobin  Date Value Ref Range Status  10/07/2021 11.9 11.1 - 15.9 g/dL Final         Failed - PLT in normal range and within 360 days    Platelets  Date Value Ref Range Status  10/07/2021 308 150 - 450 x10E3/uL Final         Failed - HCT in normal range and within 360 days    Hematocrit  Date Value Ref Range Status  10/07/2021 35.3 34.0 - 46.6 % Final         Passed - Cr in normal range and within 360 days    Creatinine, Ser  Date Value Ref Range Status  10/12/2023 0.79 0.57 - 1.00 mg/dL Final         Passed - eGFR is 30 or above and within 360 days    GFR calc Af Amer  Date Value Ref Range Status  02/25/2020 92 >59 mL/min/1.73 Final    Comment:    **[facility] currently reports eGFR in compliance with the current**   recommendations of the [employer]. [facility] will   update reporting as new guidelines are published from the NKF-ASN   Task force.    GFR calc non Af Amer  Date Value Ref Range Status  02/25/2020 80 >59 mL/min/1.73 Final   eGFR  Date Value Ref Range Status  10/12/2023 87 >59 mL/min/1.73 Final         Passed - Patient is not pregnant      Passed - Valid encounter within last 12 months    Recent Outpatient Visits           Yesterday Essential  (primary) hypertension   [facility] Simmons-Robinson, [place], MD   6 months ago Annual physical exam   [place] [facility] Simmons-Robinson, [place], MD   6 months ago Essential (primary) hypertension   [facility] Simmons-Robinson, Rockie, MD       Future Appointments             In 6 months Simmons-Robinson, Rockie, MD [facility] [place]

## 2024-04-26 MED ORDER — NAPROXEN 500 MG PO TABS
500.0000 mg | ORAL_TABLET | Freq: Two times a day (BID) | ORAL | 3 refills | Status: DC
Start: 2024-04-26 — End: 2024-05-30

## 2024-05-02 ENCOUNTER — Other Ambulatory Visit: Payer: Self-pay | Admitting: Family Medicine

## 2024-05-02 ENCOUNTER — Ambulatory Visit: Payer: Self-pay | Admitting: Family Medicine

## 2024-05-02 ENCOUNTER — Encounter: Payer: Self-pay | Admitting: Family Medicine

## 2024-05-02 DIAGNOSIS — E119 Type 2 diabetes mellitus without complications: Secondary | ICD-10-CM

## 2024-05-02 MED ORDER — METFORMIN HCL ER 500 MG PO TB24: 500.0000 mg | ORAL_TABLET | Freq: Every day | ORAL | 1 refills | Status: DC

## 2024-05-02 NOTE — Progress Notes (Signed)
 Elevated A1c of 6.5, recommended starting metformin  500mg  daily

## 2024-05-29 ENCOUNTER — Other Ambulatory Visit: Payer: Self-pay | Admitting: Family Medicine

## 2024-05-29 DIAGNOSIS — K219 Gastro-esophageal reflux disease without esophagitis: Secondary | ICD-10-CM

## 2024-05-29 DIAGNOSIS — E7800 Pure hypercholesterolemia, unspecified: Secondary | ICD-10-CM

## 2024-05-29 DIAGNOSIS — G8929 Other chronic pain: Secondary | ICD-10-CM

## 2024-05-29 DIAGNOSIS — I10 Essential (primary) hypertension: Secondary | ICD-10-CM

## 2024-05-29 NOTE — Telephone Encounter (Signed)
 [facility] faxed refill request for the following medications:  bisoprolol -hydrochlorothiazide  (ZIAC ) 5-6.25 MG tablet   naproxen  (NAPROSYN ) 500 MG tablet   omeprazole  (PRILOSEC) 20 MG capsule   rosuvastatin  (CRESTOR ) 20 MG tablet    Please advise.

## 2024-05-30 MED ORDER — ROSUVASTATIN CALCIUM 20 MG PO TABS: 20.0000 mg | ORAL_TABLET | Freq: Every day | ORAL | 3 refills | Status: DC

## 2024-05-30 MED ORDER — BISOPROLOL-HYDROCHLOROTHIAZIDE 5-6.25 MG PO TABS: 1.0000 | ORAL_TABLET | Freq: Every day | ORAL | 3 refills | Status: DC

## 2024-05-30 MED ORDER — OMEPRAZOLE 20 MG PO CPDR: DELAYED_RELEASE_CAPSULE | ORAL | 3 refills | Status: DC

## 2024-05-30 NOTE — Telephone Encounter (Signed)
 Last Visit: 04/24/2024 Next Visit: 06/14/2024 Looks like patient is switching to mail order pharmacy

## 2024-05-31 MED ORDER — NAPROXEN 500 MG PO TABS: 500.0000 mg | ORAL_TABLET | Freq: Two times a day (BID) | ORAL | 3 refills | Status: DC

## 2024-06-13 ENCOUNTER — Telehealth: Payer: Self-pay | Admitting: Family Medicine

## 2024-06-13 NOTE — Telephone Encounter (Signed)
 [facility] faxed refill request for the following medications:  metFORMIN  (GLUCOPHAGE -XR) 500 MG 24 hr tablet    Please advise.

## 2024-06-13 NOTE — Telephone Encounter (Signed)
 Pt would like to wait until tomorrow to decide if she will use mail order or local pharmacy after she is seen to make sure no changes will be made to her rx.

## 2024-06-14 ENCOUNTER — Ambulatory Visit: Admitting: Family Medicine

## 2024-06-17 ENCOUNTER — Telehealth: Payer: Self-pay | Admitting: Family Medicine

## 2024-06-17 ENCOUNTER — Ambulatory Visit: Admitting: Family Medicine

## 2024-06-17 ENCOUNTER — Other Ambulatory Visit: Payer: Self-pay | Admitting: Family Medicine

## 2024-06-17 DIAGNOSIS — E7800 Pure hypercholesterolemia, unspecified: Secondary | ICD-10-CM

## 2024-06-17 DIAGNOSIS — I10 Essential (primary) hypertension: Secondary | ICD-10-CM

## 2024-06-17 DIAGNOSIS — K219 Gastro-esophageal reflux disease without esophagitis: Secondary | ICD-10-CM

## 2024-06-17 DIAGNOSIS — G8929 Other chronic pain: Secondary | ICD-10-CM

## 2024-06-17 MED ORDER — BISOPROLOL-HYDROCHLOROTHIAZIDE 5-6.25 MG PO TABS: 1.0000 | ORAL_TABLET | Freq: Every day | ORAL | 2 refills | Status: DC

## 2024-06-17 NOTE — Telephone Encounter (Signed)
 [facility] faxed refill request for the following medications:  bisoprolol -hydrochlorothiazide  (ZIAC ) 5-6.25 MG tablet    Please advise.

## 2024-06-17 NOTE — Telephone Encounter (Signed)
 LVM requesting that patient call us  back. E2C2- please inquire on which medication there is a hold on. I am unable to correct this without knowing which one.

## 2024-06-17 NOTE — Telephone Encounter (Unsigned)
 Copied from CRM [id number]. Topic: Clinical - Medication Refill >> Jun 17, 2024  2:36 PM Amy B wrote: Medication:   bisoprolol -hydrochlorothiazide  (ZIAC ) 5-6.25 MG tablet    naproxen  (NAPROSYN ) 500 MG tablet    omeprazole  (PRILOSEC) 20 MG capsule    rosuvastatin  (CRESTOR ) 20 MG tablet   bisoprolol -hydrochlorothiazide  (ZIAC ) 5-6.25 MG tablet   Has the patient contacted their pharmacy? Yes (Agent: If no, request that the patient contact the pharmacy for the refill. If patient does not wish to contact the pharmacy document the reason why and proceed with request.) (Agent: If yes, when and what did the pharmacy advise?)  This is the patient's preferred pharmacy:   [facility] - [place], [place] - [address] [facility] [address] [place] [address] Phone: [phone] Fax: [phone]  Is this the correct pharmacy for this prescription? Yes If no, delete pharmacy and type the correct one.   Has the prescription been filled recently? No  Is the patient out of the medication? Yes  Has the patient been seen for an appointment in the last year OR does the patient have an upcoming appointment? Yes  Can we respond through MyChart? Yes  Agent: Please be advised that Rx refills may take up to 3 business days. We ask that you follow-up with your pharmacy.

## 2024-06-17 NOTE — Telephone Encounter (Signed)
 Called pt to reschedule appt from 06/17/24 and patient was stating that the pharmacy has put a hold on her medication until she has been seen by the dr and she is wanting it to be taken off hold since we are having to reschedule the appt.

## 2024-06-18 MED ORDER — BISOPROLOL-HYDROCHLOROTHIAZIDE 5-6.25 MG PO TABS: 1.0000 | ORAL_TABLET | Freq: Every day | ORAL | 3 refills | Status: AC

## 2024-06-18 MED ORDER — ROSUVASTATIN CALCIUM 20 MG PO TABS
20.0000 mg | ORAL_TABLET | Freq: Every day | ORAL | 3 refills | Status: AC
Start: 2024-06-18 — End: ?

## 2024-06-18 MED ORDER — OMEPRAZOLE 20 MG PO CPDR: DELAYED_RELEASE_CAPSULE | ORAL | 3 refills | Status: AC

## 2024-06-18 NOTE — Addendum Note (Signed)
 Addended by: CHERRY CHIQUITA HERO on: 06/18/2024 01:43 PM   Modules accepted: Orders

## 2024-06-18 NOTE — Telephone Encounter (Signed)
 Spoke with patient, patient states she is switching to mail order pharmacy. Patient has appt tomorrow and wanted to wait for metformin  to be sent just in case it changes. Will refill other meds and wait on patient's appt to refill metformin .

## 2024-06-19 ENCOUNTER — Ambulatory Visit: Admitting: Family Medicine

## 2024-06-19 ENCOUNTER — Encounter: Payer: Self-pay | Admitting: Family Medicine

## 2024-06-19 VITALS — BP 112/78 | HR 64 | Temp 98.6°F | Ht 62.0 in

## 2024-06-19 DIAGNOSIS — Z7984 Long term (current) use of oral hypoglycemic drugs: Secondary | ICD-10-CM | POA: Diagnosis not present

## 2024-06-19 DIAGNOSIS — E1169 Type 2 diabetes mellitus with other specified complication: Secondary | ICD-10-CM

## 2024-06-19 DIAGNOSIS — Z23 Encounter for immunization: Secondary | ICD-10-CM | POA: Diagnosis not present

## 2024-06-19 DIAGNOSIS — F419 Anxiety disorder, unspecified: Secondary | ICD-10-CM

## 2024-06-19 DIAGNOSIS — E1159 Type 2 diabetes mellitus with other circulatory complications: Secondary | ICD-10-CM

## 2024-06-19 DIAGNOSIS — E785 Hyperlipidemia, unspecified: Secondary | ICD-10-CM

## 2024-06-19 DIAGNOSIS — E119 Type 2 diabetes mellitus without complications: Secondary | ICD-10-CM

## 2024-06-19 DIAGNOSIS — J301 Allergic rhinitis due to pollen: Secondary | ICD-10-CM

## 2024-06-19 DIAGNOSIS — I152 Hypertension secondary to endocrine disorders: Secondary | ICD-10-CM

## 2024-06-19 DIAGNOSIS — E782 Mixed hyperlipidemia: Secondary | ICD-10-CM | POA: Diagnosis not present

## 2024-06-19 DIAGNOSIS — Z0000 Encounter for general adult medical examination without abnormal findings: Secondary | ICD-10-CM

## 2024-06-19 MED ORDER — METFORMIN HCL ER 500 MG PO TB24: 500.0000 mg | ORAL_TABLET | Freq: Every day | ORAL | 1 refills | Status: AC

## 2024-06-19 NOTE — Progress Notes (Unsigned)
 Established patient visit   Patient: Karen Henry   DOB: [birth year]   58 y.o. Female  MRN: [id number] Visit Date: 06/19/2024  Today's healthcare provider: Rockie Agent, MD   Chief Complaint  Patient presents with  . Medical Management of Chronic Issues    Patient is present for 6 week f/u, would like to recheck A1c ([facility] employee and gets labs covered)  . Hypertension  . Diabetes  . Immunizations    Patient reports a sensitivity to egg protein drug products. States she takes a benadryl when she receives her flu shot to prevent itching and would like to receive regular flu today. Egg free flu was offered, patient prefers regular flu.    Subjective     HPI     Medical Management of Chronic Issues    Additional comments: Patient is present for 6 week f/u, would like to recheck A1c ([facility] employee and gets labs covered)        Immunizations    Additional comments: Patient reports a sensitivity to egg protein drug products. States she takes a benadryl when she receives her flu shot to prevent itching and would like to receive regular flu today. Egg free flu was offered, patient prefers regular flu.       Last edited by Cherry Chiquita HERO, CMA on 06/19/2024 10:51 AM.       Discussed the use of AI scribe software for clinical note transcription with the patient, who gave verbal consent to proceed.  History of Present Illness [name] is a 58 year old female who presents for chronic follow-up.  She has a history of essential hypertension, hyperlipidemia, vitamin D  deficiency, acid reflux, allergic rhinitis, and anxiety. She prefers to avoid medications for her anxiety.  She takes a combination of 5 mg of Ziac  and 6.25 mg of hydrochlorothiazide  at 8:00 AM. She has not been monitoring her blood pressure at home but recalls a previous reading of 122/88 during her last visit.  She has hyperlipidemia with an LDL of 95, triglycerides of 212, and HDL of  37. She is currently on Crestor  for cholesterol management. She has not made any significant dietary changes recently but has reduced her intake of sweets and walks one to two miles daily.  Her last hemoglobin A1c was 6.5% two months ago, which is within the diabetes range. She is on metformin  500 mg once daily and reports no side effects. She wants to recheck her A1c today.  She missed receiving a flu shot at work and requests a regular flu shot today, despite having an egg sensitivity, as she manages it with Benadryl or Claritin. She has already taken one of these medications today.  She reports losing approximately one pound since her last visit.     Past Medical History:  Diagnosis Date  . Anxiety   . GERD (gastroesophageal reflux disease)   . High blood cholesterol level   . Hypercoagulable state   . Hypertension   . Rhinitis     Medications: Outpatient Medications Prior to Visit  Medication Sig  . aspirin 81 MG tablet Take by mouth.  SABRA Bioflavonoid Products (VITAMIN C) CHEW Chew by mouth.  . bisoprolol -hydrochlorothiazide  (ZIAC ) 5-6.25 MG tablet Take 1 tablet by mouth daily.  . Cetirizine HCl 10 MG CAPS Take by mouth.  . Cholecalciferol (VITAMIN D3) 25 MCG (1000 UT) CAPS Take 1,000 Units by mouth daily.  SABRA COENZYME Q-10 PO Take by mouth.  . diphenhydrAMINE HCl (  BENADRYL ALLERGY PO) Take by mouth at bedtime.  . naproxen  (NAPROSYN ) 500 MG tablet Take 1 tablet (500 mg total) by mouth 2 (two) times daily with a meal.  . Omega Fatty Acids-Vitamins (OMEGA-3 GUMMIES) CHEW Chew 1 tablet by mouth at bedtime.  . omeprazole  (PRILOSEC) 20 MG capsule TAKE 1 CAPSULE BY MOUTH EVERY DAY  . rosuvastatin  (CRESTOR ) 20 MG tablet Take 1 tablet (20 mg total) by mouth daily.  . triamcinolone  (KENALOG ) 0.1 % APPLY 1 APPLICATION TOPICALLY 2 (TWO) TIMES DAILY. APPLY 1 APPLICATION 2 TIMES DAILY TO FACIAL PATCHES OF DERMATITIS  . [DISCONTINUED] metFORMIN  (GLUCOPHAGE -XR) 500 MG 24 hr tablet Take 1  tablet (500 mg total) by mouth daily with breakfast.   No facility-administered medications prior to visit.    Review of Systems  Last metabolic panel Lab Results  Component Value Date   GLUCOSE 120 (H) 10/12/2023   NA 141 10/12/2023   K 4.5 10/12/2023   CL 100 10/12/2023   CO2 24 10/12/2023   BUN 12 10/12/2023   CREATININE 0.79 10/12/2023   EGFR 87 10/12/2023   CALCIUM  10.1 10/12/2023   PROT 7.4 10/12/2023   ALBUMIN 4.6 10/12/2023   LABGLOB 2.8 10/12/2023   AGRATIO 1.6 01/03/2023   BILITOT 0.3 10/12/2023   ALKPHOS 111 10/12/2023   AST 25 10/12/2023   ALT 30 10/12/2023   Last lipids Lab Results  Component Value Date   CHOL 168 04/24/2024   HDL 37 (L) 04/24/2024   LDLCALC 95 04/24/2024   TRIG 212 (H) 04/24/2024   CHOLHDL 4.5 (H) 04/24/2024   The 10-year ASCVD risk score (Arnett DK, et al., 2019) is: 3%  Last hemoglobin A1c Lab Results  Component Value Date   HGBA1C 6.5 (H) 04/24/2024     {See past labs  Heme  Chem  Endocrine  Serology  Results Review (optional):1}   Objective    BP 112/78 (Cuff Size: Normal)   Pulse 64   Temp 98.6 F (37 C) (Oral)   Ht 5' 2 (1.575 m)   SpO2 98%   BMI 30.45 kg/m   BP Readings from Last 3 Encounters:  06/19/24 112/78  04/24/24 122/88  10/26/23 113/73   Wt Readings from Last 3 Encounters:  04/24/24 166 lb 8 oz (75.5 kg)  10/26/23 167 lb (75.8 kg)  10/12/23 167 lb 14.4 oz (76.2 kg)    {See vitals history (optional):1}    Physical Exam Vitals reviewed.  Constitutional:      General: She is not in acute distress.    Appearance: Normal appearance. She is not ill-appearing.  Cardiovascular:     Rate and Rhythm: Normal rate and regular rhythm.  Pulmonary:     Effort: Pulmonary effort is normal. No respiratory distress.     Breath sounds: No wheezing, rhonchi or rales.  Neurological:     Mental Status: She is alert and oriented to person, place, and time.  Psychiatric:        Mood and Affect: Mood normal.         Behavior: Behavior normal.       No results found for any visits on 06/19/24.  Assessment & Plan     Problem List Items Addressed This Visit     Hyperlipidemia - Primary   Other Visit Diagnoses       Controlled type 2 diabetes mellitus without complication, without long-term current use of insulin (HCC)       Relevant Medications   metFORMIN  (GLUCOPHAGE -XR) 500 MG 24  hr tablet     Need for influenza vaccination            Assessment & Plan Essential hypertension Chronic Blood pressure was elevated at 120/91 initially but improved to 112/78 upon recheck. Current medication regimen includes Ziac  (bisoprolol  5mg  and hydrochlorothiazide  6.25mg ) with a low dose of hydrochlorothiazide  at 6.25 mg. No need to adjust medication as blood pressure normalized upon recheck. - Continue current antihypertensive regimen with Ziac  daily  Type 2 diabetes mellitus Chronic  Hemoglobin A1c was 6.5% two months ago, indicating diabetes. She is on metformin  500 mg daily, which she tolerates well without side effects. She has made lifestyle changes including daily walking and reducing sugar intake. - Order hemoglobin A1c test - Continue metformin  500 mg daily - Encourage continued lifestyle modifications including daily walking and reduced sugar intake  Hyperlipidemia   Allergic rhinitis She has an egg sensitivity but prefers the regular flu shot, managing potential allergic reactions with Benadryl or Claritin. - Administered regular flu shot, per pt request   Anxiety disorder She prefers to avoid medications for anxiety management.  General Health Maintenance She is due for a Pap smear for cervical cancer screening and a pneumococcal vaccine. - Recommend Pap smear for cervical cancer screening - Recommend pneumococcal vaccine     No follow-ups on file.         Rockie Agent, MD  [facility] [phone] (phone) [phone]  (fax)  [facility]

## 2024-06-19 NOTE — Patient Instructions (Signed)
 To keep you healthy, please keep in mind the following health maintenance items that you are due for:   Health Maintenance Due  Topic Date Due   Diabetic kidney evaluation - Urine ACR  Never done   Hepatitis B Vaccines 19-59 Average Risk (1 of 3 - 19+ 3-dose series) Never done   Pneumococcal Vaccine: 50+ Years (1 of 1 - PCV) Never done   Cervical Cancer Screening (HPV/Pap Cotest)  08/25/2018   Influenza Vaccine  03/29/2024     Best Wishes,   Dr. Lang

## 2024-06-19 NOTE — Assessment & Plan Note (Signed)
 Chronic  LDL was 95, triglycerides were 212, and HDL was 37. She is on Crestor  for cholesterol management. A lipid panel will be drawn today as she has not eaten, allowing for accurate results. - Order lipid panel

## 2024-06-19 NOTE — Telephone Encounter (Signed)
 Duplicate request, refilled 06/18/24.  Requested Prescriptions  Pending Prescriptions Disp Refills   bisoprolol -hydrochlorothiazide  (ZIAC ) 5-6.25 MG tablet 90 tablet 2    Sig: Take 1 tablet by mouth daily.     Cardiovascular: Beta Blocker + Diuretic Combos Failed - 06/19/2024 12:15 PM      Failed - K in normal range and within 180 days    Potassium  Date Value Ref Range Status  10/12/2023 4.5 3.5 - 5.2 mmol/L Final         Failed - Na in normal range and within 180 days    Sodium  Date Value Ref Range Status  10/12/2023 141 134 - 144 mmol/L Final         Failed - Cr in normal range and within 180 days    Creatinine, Ser  Date Value Ref Range Status  10/12/2023 0.79 0.57 - 1.00 mg/dL Final         Failed - eGFR in normal range and within 180 days    GFR calc Af Amer  Date Value Ref Range Status  02/25/2020 92 >59 mL/min/1.73 Final    Comment:    **[facility] currently reports eGFR in compliance with the current**   recommendations of the [employer]. [facility] will   update reporting as new guidelines are published from the NKF-ASN   Task force.    GFR calc non Af Amer  Date Value Ref Range Status  02/25/2020 80 >59 mL/min/1.73 Final   eGFR  Date Value Ref Range Status  10/12/2023 87 >59 mL/min/1.73 Final         Passed - Last BP in normal range    BP Readings from Last 1 Encounters:  06/19/24 112/78         Passed - Last Heart Rate in normal range    Pulse Readings from Last 1 Encounters:  06/19/24 64         Passed - Valid encounter within last 6 months    Recent Outpatient Visits           Today Mixed hyperlipidemia   [place] [facility] Simmons-Robinson, [place], MD   1 month ago Essential (primary) hypertension   [facility] Simmons-Robinson, [place], MD   7 months ago Annual physical exam   [facility] Simmons-Robinson, [place], MD   8 months ago Essential  (primary) hypertension   [facility] Simmons-Robinson, Rockie, MD       Future Appointments             In 4 months Simmons-Robinson, Rockie, MD [facility] [place]

## 2024-06-20 LAB — MICROALBUMIN / CREATININE URINE RATIO
Creatinine, Urine: 35.7 mg/dL
Microalb/Creat Ratio: <8 mg/g{creat} (ref 0–29)
Microalbumin, Urine: <3 ug/mL

## 2024-06-20 LAB — LIPID PANEL
Chol/HDL Ratio: 3.5 ratio (ref 0.0–4.4)
Cholesterol, Total: 134 mg/dL (ref 100–199)
HDL: 38 mg/dL — ABNORMAL LOW (ref >39)
LDL Chol Calc (NIH): 70 mg/dL (ref 0–99)
Triglycerides: 150 mg/dL — ABNORMAL HIGH (ref 0–149)
VLDL Cholesterol Cal: 26 mg/dL (ref 5–40)

## 2024-06-20 LAB — HEMOGLOBIN A1C
Est. average glucose Bld gHb Est-mCnc: 123 mg/dL
Hgb A1c MFr Bld: 5.9 % — ABNORMAL HIGH (ref 4.8–5.6)

## 2024-06-20 MED ORDER — NAPROXEN 500 MG PO TABS: 500.0000 mg | ORAL_TABLET | Freq: Two times a day (BID) | ORAL | 3 refills | Status: DC

## 2024-06-20 NOTE — Addendum Note (Signed)
 Addended by: SIMMONS-ROBINSON, Jana Swartzlander L on: 06/20/2024 07:02 AM   Modules accepted: Orders

## 2024-06-21 ENCOUNTER — Other Ambulatory Visit: Payer: Self-pay | Admitting: Family Medicine

## 2024-06-21 ENCOUNTER — Ambulatory Visit: Payer: Self-pay | Admitting: Family Medicine

## 2024-06-21 DIAGNOSIS — E119 Type 2 diabetes mellitus without complications: Secondary | ICD-10-CM | POA: Insufficient documentation

## 2024-06-21 DIAGNOSIS — Z0000 Encounter for general adult medical examination without abnormal findings: Secondary | ICD-10-CM | POA: Insufficient documentation

## 2024-06-21 DIAGNOSIS — G8929 Other chronic pain: Secondary | ICD-10-CM

## 2024-06-21 NOTE — Assessment & Plan Note (Signed)
 Essential hypertension Chronic Blood pressure was elevated at 120/91 initially but improved to 112/78 upon recheck. Current medication regimen includes Ziac  (bisoprolol  5mg  and hydrochlorothiazide  6.25mg ) with a low dose of hydrochlorothiazide  at 6.25 mg. No need to adjust medication as blood pressure normalized upon recheck. - Continue current antihypertensive regimen with Ziac  daily

## 2024-06-21 NOTE — Assessment & Plan Note (Signed)
   General Health Maintenance She is due for a Pap smear for cervical cancer screening and a pneumococcal vaccine. - Recommend Pap smear for cervical cancer screening - Recommend pneumococcal vaccine

## 2024-06-21 NOTE — Assessment & Plan Note (Signed)
  Allergic rhinitis She has an egg sensitivity but prefers the regular flu shot, managing potential allergic reactions with Benadryl or Claritin. - Administered regular flu shot, per pt request

## 2024-06-21 NOTE — Assessment & Plan Note (Signed)
 Chronic  Hemoglobin A1c was 6.5% two months ago, indicating diabetes. She is on metformin  500 mg daily, which she tolerates well without side effects. She has made lifestyle changes including daily walking and reducing sugar intake. - Order hemoglobin A1c test - Continue metformin  500 mg daily - Encourage continued lifestyle modifications including daily walking and reduced sugar intake

## 2024-06-21 NOTE — Assessment & Plan Note (Signed)
  Anxiety disorder She prefers to avoid medications for anxiety management. -Continue lifestyle management

## 2024-08-20 ENCOUNTER — Encounter: Payer: Self-pay | Admitting: *Deleted

## 2024-09-02 ENCOUNTER — Ambulatory Visit: Admitting: Anesthesiology

## 2024-09-02 ENCOUNTER — Encounter
Admission: RE | Disposition: A | Discharge status: Home / Self Care | Payer: Self-pay | Source: Home / Self Care | Attending: Gastroenterology

## 2024-09-02 ENCOUNTER — Ambulatory Visit
Admission: RE | Admit: 2024-09-02 | Discharge: 2024-09-02 | Disposition: A | Discharge status: Home / Self Care | Attending: Gastroenterology | Admitting: Gastroenterology

## 2024-09-02 DIAGNOSIS — I10 Essential (primary) hypertension: Secondary | ICD-10-CM | POA: Diagnosis not present

## 2024-09-02 DIAGNOSIS — E119 Type 2 diabetes mellitus without complications: Secondary | ICD-10-CM | POA: Diagnosis not present

## 2024-09-02 DIAGNOSIS — K640 First degree hemorrhoids: Secondary | ICD-10-CM | POA: Diagnosis not present

## 2024-09-02 DIAGNOSIS — K219 Gastro-esophageal reflux disease without esophagitis: Secondary | ICD-10-CM | POA: Diagnosis not present

## 2024-09-02 DIAGNOSIS — Z79899 Other long term (current) drug therapy: Secondary | ICD-10-CM | POA: Diagnosis not present

## 2024-09-02 DIAGNOSIS — Z1211 Encounter for screening for malignant neoplasm of colon: Secondary | ICD-10-CM | POA: Diagnosis not present

## 2024-09-02 DIAGNOSIS — E785 Hyperlipidemia, unspecified: Secondary | ICD-10-CM | POA: Diagnosis not present

## 2024-09-02 DIAGNOSIS — Z7984 Long term (current) use of oral hypoglycemic drugs: Secondary | ICD-10-CM | POA: Insufficient documentation

## 2024-09-02 DIAGNOSIS — R195 Other fecal abnormalities: Secondary | ICD-10-CM | POA: Insufficient documentation

## 2024-09-02 HISTORY — PX: COLONOSCOPY: SHX5424

## 2024-09-02 MED ORDER — SODIUM CHLORIDE 0.9 % IV SOLN
INTRAVENOUS | Status: DC
  Administered 2024-09-02: 500 mL via INTRAVENOUS

## 2024-09-02 MED ORDER — PROPOFOL 500 MG/50ML IV EMUL
INTRAVENOUS | Status: DC | PRN
  Administered 2024-09-02: 125 ug/kg/min via INTRAVENOUS
  Administered 2024-09-02: 100 mg via INTRAVENOUS

## 2024-09-02 MED ORDER — LIDOCAINE HCL (CARDIAC) PF 100 MG/5ML IV SOSY
PREFILLED_SYRINGE | INTRAVENOUS | Status: DC | PRN
  Administered 2024-09-02: 50 mg via INTRAVENOUS

## 2024-09-02 NOTE — H&P (Signed)
 Outpatient short stay form Pre-procedure 09/02/2024  Karen ONEIDA Schick, MD  Primary Physician: Sharma Coyer, MD  Reason for visit:  FIT positive  History of present illness:    59 y/o lady with history of hypertension, HLD, and GERD here for colonoscopy due to FIT positive test. Last colonoscopy in 2018 was unremarkable. No blood thinners except aspirin. No abdominal surgeries. No family history of GI malignancies.   Current Medications[1]  Medications Prior to Admission  Medication Sig Dispense Refill Last Dose/Taking   aspirin 81 MG tablet Take by mouth.   09/01/2024 Morning   Bioflavonoid Products (VITAMIN C) CHEW Chew by mouth.   Past Week   bisoprolol -hydrochlorothiazide  (ZIAC ) 5-6.25 MG tablet Take 1 tablet by mouth daily. 90 tablet 3 09/01/2024 Morning   Cetirizine HCl 10 MG CAPS Take by mouth.   09/01/2024 Morning   Cholecalciferol (VITAMIN D3) 25 MCG (1000 UT) CAPS Take 1,000 Units by mouth daily.   09/01/2024 Morning   COENZYME Q-10 PO Take by mouth.   09/01/2024 Morning   diphenhydrAMINE HCl (BENADRYL ALLERGY PO) Take by mouth at bedtime.   Past Week   metFORMIN  (GLUCOPHAGE -XR) 500 MG 24 hr tablet Take 1 tablet (500 mg total) by mouth daily with breakfast. 90 tablet 1 09/01/2024 Morning   naproxen  (NAPROSYN ) 500 MG tablet TAKE 1 TABLET TWICE DAILY  WITH MEALS 180 tablet 3 Past Week   Omega Fatty Acids-Vitamins (OMEGA-3 GUMMIES) CHEW Chew 1 tablet by mouth at bedtime.   Past Week   omeprazole  (PRILOSEC) 20 MG capsule TAKE 1 CAPSULE BY MOUTH EVERY DAY 90 capsule 3 09/01/2024 Morning   rosuvastatin  (CRESTOR ) 20 MG tablet Take 1 tablet (20 mg total) by mouth daily. 90 tablet 3 09/01/2024 Evening   triamcinolone  (KENALOG ) 0.1 % APPLY 1 APPLICATION TOPICALLY 2 (TWO) TIMES DAILY. APPLY 1 APPLICATION 2 TIMES DAILY TO FACIAL PATCHES OF DERMATITIS 30 g 1 Past Week     Allergies[2]   Past Medical History:  Diagnosis Date   Anxiety    GERD (gastroesophageal reflux disease)    High  blood cholesterol level    Hypercoagulable state    Hypertension    Rhinitis     Review of systems:  Otherwise negative.    Physical Exam  Gen: Alert, oriented. Appears stated age.  HEENT: PERRLA. Lungs: No respiratory distress CV: RRR Abd: soft, benign, no masses Ext: No edema    Planned procedures: Proceed with colonoscopy. The patient understands the nature of the planned procedure, indications, risks, alternatives and potential complications including but not limited to bleeding, infection, perforation, damage to internal organs and possible oversedation/side effects from anesthesia. The patient agrees and gives consent to proceed.  Please refer to procedure notes for findings, recommendations and patient disposition/instructions.     Karen ONEIDA Schick, MD [facility]         [1]  Current Facility-Administered Medications:    0.9 %  sodium chloride  infusion, , Intravenous, Continuous, Jarryd Gratz, Karen ONEIDA, MD, Last Rate: 20 mL/hr at 09/02/24 0755, 500 mL at 09/02/24 0755 [2]  Allergies Allergen Reactions   Egg Protein-Containing Drug Products Itching   Penicillins Rash

## 2024-09-02 NOTE — Interval H&P Note (Signed)
 History and Physical Interval Note:  09/02/2024 8:37 AM  Karen Henry  has presented today for surgery, with the diagnosis of Heme + stool (R19.5).  The various methods of treatment have been discussed with the patient and family. After consideration of risks, benefits and other options for treatment, the patient has consented to  Procedures: COLONOSCOPY (N/A) as a surgical intervention.  The patient's history has been reviewed, patient examined, no change in status, stable for surgery.  I have reviewed the patient's chart and labs.  Questions were answered to the patient's satisfaction.     Karen Henry  Ok to proceed with colonoscopy

## 2024-09-02 NOTE — Anesthesia Preprocedure Evaluation (Signed)
"                                    Anesthesia Evaluation  Patient identified by MRN, date of birth, ID band Patient awake    Reviewed: Allergy & Precautions, NPO status , Patient's Chart, lab work & pertinent test results  Airway Mallampati: II  TM Distance: >3 FB Neck ROM: full    Dental  (+) Teeth Intact   Pulmonary neg pulmonary ROS   Pulmonary exam normal        Cardiovascular Exercise Tolerance: Good hypertension, Pt. on medications negative cardio ROS Normal cardiovascular exam Rhythm:Regular Rate:Normal     Neuro/Psych   Anxiety     negative neurological ROS  negative psych ROS   GI/Hepatic negative GI ROS, Neg liver ROS,GERD  Medicated,,  Endo/Other  negative endocrine ROSdiabetes, Type 2, Oral Hypoglycemic Agents    Renal/GU negative Renal ROS  negative genitourinary   Musculoskeletal negative musculoskeletal ROS (+)    Abdominal  (+) + obese  Peds negative pediatric ROS (+)  Hematology negative hematology ROS (+)   Anesthesia Other Findings Past Medical History: No date: Anxiety No date: GERD (gastroesophageal reflux disease) No date: High blood cholesterol level No date: Hypercoagulable state No date: Hypertension No date: Rhinitis  Past Surgical History: No date: BREAST SURGERY 03/24/2017: COLONOSCOPY WITH PROPOFOL ; N/A     Comment:  Procedure: COLONOSCOPY WITH PROPOFOL ;  Surgeon: Viktoria Lamar DASEN, MD;  Location: [facility] ENDOSCOPY;  Service:               Endoscopy;  Laterality: N/A; No date: TUBAL LIGATION  BMI    Body Mass Index: 29.48 kg/m      Reproductive/Obstetrics negative OB ROS                              Anesthesia Physical Anesthesia Plan  ASA: 2  Anesthesia Plan: General   Post-op Pain Management:    Induction: Intravenous  PONV Risk Score and Plan: Propofol  infusion and TIVA  Airway Management Planned: Natural Airway and Nasal Cannula  Additional Equipment:    Intra-op Plan:   Post-operative Plan:   Informed Consent: I have reviewed the patients History and Physical, chart, labs and discussed the procedure including the risks, benefits and alternatives for the proposed anesthesia with the patient or authorized representative who has indicated his/her understanding and acceptance.     Dental Advisory Given  Plan Discussed with: CRNA  Anesthesia Plan Comments:         Anesthesia Quick Evaluation  "

## 2024-09-02 NOTE — Anesthesia Procedure Notes (Signed)
 Date/Time: 09/02/2024 8:38 AM  Performed by: Dominica Krabbe, CRNAPre-anesthesia Checklist: Patient identified, Emergency Drugs available, Suction available and Patient being monitored Patient Re-evaluated:Patient Re-evaluated prior to induction Oxygen Delivery Method: Nasal cannula Preoxygenation: Pre-oxygenation with 100% oxygen Induction Type: IV induction

## 2024-09-02 NOTE — Anesthesia Postprocedure Evaluation (Signed)
"   Anesthesia Post Note  Patient: Karen Henry  Procedure(s) Performed: COLONOSCOPY  Patient location during evaluation: PACU Anesthesia Type: General Level of consciousness: awake Pain management: pain level controlled Vital Signs Assessment: post-procedure vital signs reviewed and stable Respiratory status: spontaneous breathing Cardiovascular status: stable Anesthetic complications: no   No notable events documented.   Last Vitals:  Vitals:   09/02/24 0912 09/02/24 0922  BP: 97/73 108/76  Pulse: 68 63  Resp: 17 14  Temp:    SpO2: 97% 97%    Last Pain:  Vitals:   09/02/24 0922  TempSrc:   PainSc: 0-No pain                 VAN STAVEREN,Darrio Bade      "

## 2024-09-02 NOTE — Op Note (Signed)
 [facility] Gastroenterology Patient Name: Karen Henry Procedure Date: 09/02/2024 8:35 AM MRN: [id number] Account #: [id number] Date of Birth: 26-Dec-1965 Admit Type: Outpatient Age: 59 Room: [facility] ENDO ROOM 3 Gender: Female Note Status: Finalized Instrument Name: Colon Scope [id number] Procedure:             Colonoscopy Indications:           Positive fecal immunochemical test Providers:             Ole Schick MD, MD Referring MD:          No Local Md, MD (Referring MD) Medicines:             Monitored Anesthesia Care Complications:         No immediate complications. Procedure:             Pre-Anesthesia Assessment:                        - Prior to the procedure, a History and Physical was                         performed, and patient medications and allergies were                         reviewed. The patient is competent. The risks and                         benefits of the procedure and the sedation options and                         risks were discussed with the patient. All questions                         were answered and informed consent was obtained.                         Patient identification and proposed procedure were                         verified by the physician, the nurse, the                         anesthesiologist, the anesthetist and the technician                         in the endoscopy suite. Mental Status Examination:                         alert and oriented. Airway Examination: normal                         oropharyngeal airway and neck mobility. Respiratory                         Examination: clear to auscultation. CV Examination:                         normal. Prophylactic Antibiotics: The patient does not  require prophylactic antibiotics. Prior                         Anticoagulants: The patient has taken no anticoagulant                         or antiplatelet agents. ASA Grade Assessment: II  - A                         patient with mild systemic disease. After reviewing                         the risks and benefits, the patient was deemed in                         satisfactory condition to undergo the procedure. The                         anesthesia plan was to use monitored anesthesia care                         (MAC). Immediately prior to administration of                         medications, the patient was re-assessed for adequacy                         to receive sedatives. The heart rate, respiratory                         rate, oxygen saturations, blood pressure, adequacy of                         pulmonary ventilation, and response to care were                         monitored throughout the procedure. The physical                         status of the patient was re-assessed after the                         procedure.                        After obtaining informed consent, the colonoscope was                         passed under direct vision. Throughout the procedure,                         the patient's blood pressure, pulse, and oxygen                         saturations were monitored continuously. The                         Colonoscope was introduced through the anus and  advanced to the the terminal ileum, with                         identification of the appendiceal orifice and IC                         valve. The colonoscopy was performed without                         difficulty. The patient tolerated the procedure well.                         The quality of the bowel preparation was good. The                         terminal ileum, ileocecal valve, appendiceal orifice,                         and rectum were photographed. Findings:      The perianal and digital rectal examinations were normal.      The terminal ileum appeared normal.      Internal hemorrhoids were found during retroflexion. The hemorrhoids       were  Grade I (internal hemorrhoids that do not prolapse).      The exam was otherwise without abnormality on direct and retroflexion       views. Impression:            - The examined portion of the ileum was normal.                        - Internal hemorrhoids.                        - The examination was otherwise normal on direct and                         retroflexion views.                        - No specimens collected. Recommendation:        - Discharge patient to home.                        - Resume previous diet.                        - Continue present medications.                        - Repeat colonoscopy in 10 years for screening                         purposes.                        - Return to referring physician as previously                         scheduled. Procedure Code(s):     --- Professional ---  54621, Colonoscopy, flexible; diagnostic, including                         collection of specimen(s) by brushing or washing, when                         performed (separate procedure) Diagnosis Code(s):     --- Professional ---                        K64.0, First degree hemorrhoids                        R19.5, Other fecal abnormalities CPT copyright 2022 [employer]. All rights reserved. The codes documented in this report are preliminary and upon coder review may  be revised to meet current compliance requirements. Ole Schick MD, MD 09/02/2024 9:05:07 AM Number of Addenda: 0 Note Initiated On: 09/02/2024 8:35 AM Scope Withdrawal Time: 0 hours 8 minutes 59 seconds  Total Procedure Duration: 0 hours 15 minutes 10 seconds  Estimated Blood Loss:  Estimated blood loss: none.      [facility]

## 2024-09-02 NOTE — Transfer of Care (Signed)
 Immediate Anesthesia Transfer of Care Note  Patient: Karen Henry  Procedure(s) Performed: COLONOSCOPY  Patient Location: Endoscopy Unit  Anesthesia Type:General  Level of Consciousness: awake, alert , and oriented  Airway & Oxygen Therapy: Patient Spontanous Breathing  Post-op Assessment: Report given to RN and Post -op Vital signs reviewed and stable  Post vital signs: Reviewed and stable  Last Vitals:  Vitals Value Taken Time  BP 105/73 09/02/24 09:04  Temp 36.2 C 09/02/24 09:03  Pulse 68 09/02/24 09:04  Resp 16 09/02/24 09:04  SpO2 99 % 09/02/24 09:04    Last Pain:  Vitals:   09/02/24 0903  TempSrc: Tympanic  PainSc: 0-No pain         Complications: No notable events documented.

## 2024-10-28 ENCOUNTER — Encounter: Payer: BC Managed Care – PPO | Admitting: Family Medicine
